# Patient Record
Sex: Female | Born: 1975 | Race: White | Hispanic: Refuse to answer | Marital: Single | Smoking: Never smoker
Health system: Southern US, Community
[De-identification: ages and names within clinical notes are randomized; demographics above are authoritative.]

## PROBLEM LIST (undated history)

## (undated) DIAGNOSIS — J45909 Unspecified asthma, uncomplicated: Secondary | ICD-10-CM

## (undated) DIAGNOSIS — R569 Unspecified convulsions: Secondary | ICD-10-CM

## (undated) DIAGNOSIS — J302 Other seasonal allergic rhinitis: Secondary | ICD-10-CM

## (undated) DIAGNOSIS — F909 Attention-deficit hyperactivity disorder, unspecified type: Secondary | ICD-10-CM

## (undated) DIAGNOSIS — E119 Type 2 diabetes mellitus without complications: Secondary | ICD-10-CM

## (undated) HISTORY — PX: OTHER SURGICAL HISTORY: SHX169

## (undated) HISTORY — PX: BREAST BIOPSY: SHX20

## (undated) HISTORY — DX: Type 2 diabetes mellitus without complications: E11.9

## (undated) HISTORY — DX: Attention-deficit hyperactivity disorder, unspecified type: F90.9

## (undated) HISTORY — DX: Unspecified convulsions: R56.9

## (undated) HISTORY — DX: Unspecified asthma, uncomplicated: J45.909

## (undated) HISTORY — DX: Other seasonal allergic rhinitis: J30.2

---

## 2017-08-02 ENCOUNTER — Encounter (INDEPENDENT_AMBULATORY_CARE_PROVIDER_SITE_OTHER): Payer: Self-pay | Admitting: Physician Assistant

## 2017-08-02 ENCOUNTER — Other Ambulatory Visit (INDEPENDENT_AMBULATORY_CARE_PROVIDER_SITE_OTHER): Payer: Self-pay | Admitting: Physician Assistant

## 2017-08-02 ENCOUNTER — Ambulatory Visit (INDEPENDENT_AMBULATORY_CARE_PROVIDER_SITE_OTHER): Payer: No Typology Code available for payment source | Admitting: Physician Assistant

## 2017-08-02 VITALS — BP 139/84 | HR 86 | Temp 97.8°F | Ht 61.89 in | Wt 296.0 lb

## 2017-08-02 DIAGNOSIS — J4520 Mild intermittent asthma, uncomplicated: Secondary | ICD-10-CM

## 2017-08-02 DIAGNOSIS — G40909 Epilepsy, unspecified, not intractable, without status epilepticus: Secondary | ICD-10-CM

## 2017-08-02 DIAGNOSIS — F902 Attention-deficit hyperactivity disorder, combined type: Secondary | ICD-10-CM

## 2017-08-02 MED ORDER — NORTRIPTYLINE HCL 25 MG PO CAPS
25.00 mg | ORAL_CAPSULE | Freq: Every evening | ORAL | 1 refills | Status: DC
Start: 2017-08-02 — End: 2017-12-07

## 2017-08-02 MED ORDER — FLUTICASONE PROPIONATE HFA 110 MCG/ACT IN AERO
1.00 | INHALATION_SPRAY | Freq: Two times a day (BID) | RESPIRATORY_TRACT | 5 refills | Status: DC
Start: 2017-08-02 — End: 2017-11-12

## 2017-08-02 NOTE — Progress Notes (Signed)
Have you seen any specialists/other providers since your last visit with us?    Yes    Arm preference verified?   Yes    The patient is due for influenza vaccine

## 2017-08-02 NOTE — Progress Notes (Signed)
Subjective:      Patient ID: [name] [name]  is a 42 y.o.  female. She is new to my practice, moved from [facility]    Chief Complaint   Patient presents with   . Establish Care     medication refill and consultation.      HPI  [name] [name] is looking to est'd new PCP. PMhx below. She is interested in changing her ADHD/Seizure medications.  Reports last Neurology work up was > 10 yrs ago. Last known sx > 44yrs ago  She did have a  Psychiatrist in [place] who managed her current medications    Hx asthma and requesting maintenance  inhaler for the winter months    Past Medical History:   Diagnosis Date   . Asthma    . Attention deficit hyperactivity disorder (ADHD)    . Convulsions    . Seasonal allergic rhinitis           The following sections were reviewed this encounter by the provider:   Tobacco  Allergies  Meds  Problems  Med Hx  Surg Hx  Fam Hx  Soc Hx         Review of Systems   Constitutional: Negative.  Negative for activity change, appetite change and unexpected weight change.   Cardiovascular: Negative for chest pain.   Neurological: Negative for dizziness, tremors, seizures, syncope, light-headedness and headaches.   Psychiatric/Behavioral: Negative for agitation, behavioral problems, decreased concentration (at times), dysphoric mood and sleep disturbance. The patient is hyperactive (at times). The patient is not nervous/anxious.          BP 139/84 (BP Site: Right arm, Patient Position: Sitting, Cuff Size: Large)   Pulse 86   Temp 97.8 F (36.6 C) (Oral)   Ht 1.572 m (5' 1.89")   Wt 134.3 kg (296 lb)   LMP 07/19/2017 (Approximate)   BMI 54.33 kg/m     Objective:   Physical Exam   Constitutional: She is oriented to person, place, and time. She appears well-developed and well-nourished.   HENT:   Head: Normocephalic.   Eyes: Pupils are equal, round, and reactive to light.   Cardiovascular: Normal rate, regular rhythm and normal heart sounds.    No murmur heard.  Pulmonary/Chest: Effort normal and breath  sounds normal.   Neurological: She is alert and oriented to person, place, and time. No cranial nerve deficit. Coordination normal.   Psychiatric: She has a normal mood and affect. Her behavior is normal. Judgment and thought content normal.        Assessment:   1. Attention deficit hyperactivity disorder (ADHD), combined type  - nortriptyline (PAMELOR) 25 MG capsule; Take 1 capsule (25 mg total) by mouth nightly.  Dispense: 30 capsule; Refill: 1  - PMP Drug Monitoring 17 Panel, Urine    2. Seizure disorder  - Neurology Referral: Eleanora Neighbor. Stacy Gardner, MD [facility])    3. Mild intermittent asthma, unspecified whether complicated  - fluticasone (FLOVENT HFA) 110 MCG/ACT inhaler; Inhale 1 puff into the lungs 2 (two) times daily.  Dispense: 1 Inhaler; Refill: 5         Plan:   PMP reviewed today  Pt contract signed  Pending urine drug screen, will then refill Adderall  Referred to Neuro for seizure evaluation    Risk & Benefits of any new medication(s) were explained to the patient who verbalized understanding & agreed to the treatment plan.     Call if symptoms persist, worsen, or change.  Call  with updates/questions/concerns.    Cristy Friedlander, MPH

## 2017-08-08 LAB — PMP DRUG MONITORING, 17 PANEL, URINE

## 2017-08-13 ENCOUNTER — Other Ambulatory Visit (INDEPENDENT_AMBULATORY_CARE_PROVIDER_SITE_OTHER): Payer: Self-pay | Admitting: Physician Assistant

## 2017-08-13 MED ORDER — AMPHETAMINE-DEXTROAMPHETAMINE 20 MG PO TABS
20.00 mg | ORAL_TABLET | Freq: Three times a day (TID) | ORAL | 0 refills | Status: DC
Start: 2017-08-13 — End: 2017-08-13

## 2017-08-13 MED ORDER — AMPHETAMINE-DEXTROAMPHETAMINE 20 MG PO TABS
20.00 mg | ORAL_TABLET | Freq: Three times a day (TID) | ORAL | 0 refills | Status: DC
Start: 2017-08-13 — End: 2017-12-07

## 2017-09-18 ENCOUNTER — Ambulatory Visit (HOSPITAL_BASED_OUTPATIENT_CLINIC_OR_DEPARTMENT_OTHER): Payer: No Typology Code available for payment source | Admitting: Neurology

## 2017-10-19 ENCOUNTER — Encounter (INDEPENDENT_AMBULATORY_CARE_PROVIDER_SITE_OTHER): Payer: Self-pay | Admitting: Neurology

## 2017-10-19 ENCOUNTER — Ambulatory Visit (INDEPENDENT_AMBULATORY_CARE_PROVIDER_SITE_OTHER): Payer: No Typology Code available for payment source | Admitting: Neurology

## 2017-10-19 VITALS — BP 139/87 | HR 92 | Ht 63.0 in | Wt 310.0 lb

## 2017-10-19 DIAGNOSIS — R569 Unspecified convulsions: Secondary | ICD-10-CM

## 2017-10-19 DIAGNOSIS — Z87898 Personal history of other specified conditions: Secondary | ICD-10-CM

## 2017-10-20 NOTE — Progress Notes (Signed)
Subjective:           Patient ID: [name] [name] is a 42 y.o. female here for Seizures  .         Seizures        Pt referred for the evaluation of seizures. Pt moved from [place]. She has a h/o seizures and was placed on AEDs from Grade 3 to senior in high school. No seizures during this period. However pt stopped the AEDs suddenly, without doctr consent, just felt it is not needed. Also she stopped taking her ADHD meds.    Since then, pt experiencing seizure like activity, but not sure. She has episodes of time gaps, unresponsive spells. But never escalated to the grand mal seizures or LOC.     Review of Systems   Neurological: Positive for seizures.     Current Outpatient Prescriptions on File Prior to Visit   Medication Sig Dispense Refill   . albuterol (PROVENTIL HFA;VENTOLIN HFA) 108 (90 Base) MCG/ACT inhaler Inhale 2 puffs into the lungs every 4 (four) hours as needed for Wheezing.     Marland Kitchen amphetamine-dextroamphetamine (ADDERALL) 20 MG tablet Take 1 tablet (20 mg total) by mouth 3 (three) times daily.Supervising Provider is Duwayne Heck, MD  REfill after 10/11/17 90 tablet 0   . fexofenadine-pseudoephedrine (ALLEGRA-D) 60-120 MG per tablet Take 1 tablet by mouth 2 (two) times daily.     . fluticasone (FLOVENT HFA) 110 MCG/ACT inhaler Inhale 1 puff into the lungs 2 (two) times daily. 1 Inhaler 5   . nortriptyline (PAMELOR) 25 MG capsule Take 1 capsule (25 mg total) by mouth nightly. 30 capsule 1   . vitamins/minerals Tab Take 1 tablet by mouth daily.       No current facility-administered medications on file prior to visit.          All systems were reviewed and were negative except as described in the HPI.    Family h/o: reviewed not contributory  Past h/o as in assessment  Social h/o: Denies smoking or drugs. Takes ETOH occasionally and socially.            Objective:      Physical Exam Neurologic Exam    Vital Signs:  Reviewed    General: The patient was well developed and well nourished.  No acute distress.  Cooperative with the exam  Neck:  no carotid bruits  CVS: RRR, no murmurs, rubs,or gallops  Resp: CTA bilaterally, no retractions  Abd: Soft, nontender  Extremities: no pedal edema, extremities normal in color    Mental Status: The patient was awake, alert and oriented to person, place, and time.  Affect is normal  Fund of knowledge appropriate  Recent and remote memory are intact   Attention span and concentration appear normal.  Language function is normal. There is no evidence of aphasia in conversational speech.    Fundi: no papilledema  Neck: NO occipital nerve  tenderness. ROM appear normal    Cranial nerves:   -CN II: Visual fields full to bedside confrontation   -CN III, IV, VI: Pupils equal, round, and reactive to light; extraocular movements intact; no ptosis              -CN V: Facial sensation intact in V1 through V3 distributions   -CN VII: Face symmetric   -CN VIII: Hearing intact to conversational speech   -CN IX, X: Palate elevates symmetrically; normal phonation   -CN XI: Symmetric full strength of sternocleidomastoid and trapezius muscles   -  CN XII: Tongue protrudes midline    Motor: Muscle tone normal without spasticity or flaccidity. No atrophy.  No fasiculations. No pronator drift.  Strength  R / L    R / L  Deltoid  5 / 5  Hip Flexion 5 / 5  Triceps  5 / 5   Hip extension 5 / 5  Biceps  5 / 5   Knee flexion 5 / 5  Wrist ext 5 / 5  Knee ext 5 / 5  Wrist flexion 5 / 5  Dorsiflexion 5 / 5  FF   5 / 5  Plantar flexion 5 / 5    Sensory:   Light touch intact.  Pinprick intact.  Temperature intact.  Vibration intact.  Proprioception intact.    Reflexes:  R / L     R / L  Biceps  2 / 2  Knees  2 / 2  Triceps 2 / 2  Ankles  2 / 2  BR                   2 / 2  Plantars Flexor / Flexor    Coordination: FTN and HKS intact, no truncal ataxia. RAMs intact. No tremors    Gait: Station normal, gait stable   Tandem walk intact.   Romberg negative.            Assessment:       H/o seizures since age 53.   Treated  with AEDs from age 35 to 48.  Seizures with LOA and staring/ unresponsive spells  Currently off AEDs > 10 years  Recurrent seizure like activity  Also diagnosed ADHD on meds  Epilepsy risk factors: Childhood seizures, No TBI/Encephalitis, No family h/o seizures  MRI/EEG done in the past > 10 years. Results N/A      Recommend:    Hold on AEDs  Will do EMu to confirm the epilepsy  MRI during       Patient needs EMU for 5 days:    Patient not on any AEDs and localize seizures for further management: AED optimization versus VNS versus neuro pace  Sleep deprivation, hyperventilation and photic stimulation from day 1  Probably  needs additional Keppra XR.        Informed consent regarding EMU monitoring: Explaining the procedure, including audiovisual recording, explaining the risk of medication reduction has been taken.  Consent form scanned into the patient's chart        Plan:      No orders of the defined types were placed in this encounter.          As above.     Additional notes and data scanned including patient questionnaire which may contain pertinent information to visit. Patient can follow up sooner if needed. In the meantime, patient will contact the office with any questions or concerns.       Counseled the patient re: spent explaining the natural history of seizures, types of seizures, triggers for seizures.  Need for the necessary testing.  EEG and MRI.  Need for a seizure medication compliance for medications.  Seizure precautions including avoid driving per [place].  6 months from the last seizure.  Side effects of seizure medications.  Avoiding triggers for seizures.  Backup plan, and the time of partial seizure and convulsive seizures.  Including going to the emergency room.  Taking emergency rescue medications.           Kimarie Coor,  MD - [facility]  NEUROLOGY  Available on XTEND paging  Board Certified in Neurology by ABPN  Board Certified Clinical Neurophysiology by ABPN     [address] [place]  [place] [address]  T [phone] F [phone]       [email or web address]      This note was generated by the Epic EMR system/ Dragon speech recognition and may contain inherent errors or omissions not intended by the user. Grammatical errors, random word insertions, deletions, pronoun errors and incomplete sentences are occasional consequences of this technology due to software limitations. Not all errors are caught or corrected.Although every attempt is made to root out erroneus and incomplete transcription, the note may still not fully represent the intent or opinion of the author. If there are questions or concerns about the content of this note or information contained within the body of this dictation they should be addressed directly with the author for clarification.*      Rosalio Macadamia, MD

## 2017-11-12 ENCOUNTER — Inpatient Hospital Stay: Payer: No Typology Code available for payment source

## 2017-11-12 ENCOUNTER — Inpatient Hospital Stay
Admission: RE | Admit: 2017-11-12 | Discharge: 2017-11-15 | DRG: 884 | Disposition: A | Discharge status: Home / Self Care | Payer: No Typology Code available for payment source | Source: Ambulatory Visit | Attending: Neurology | Admitting: Neurology

## 2017-11-12 DIAGNOSIS — R9401 Abnormal electroencephalogram [EEG]: Secondary | ICD-10-CM

## 2017-11-12 DIAGNOSIS — R404 Transient alteration of awareness: Principal | ICD-10-CM | POA: Diagnosis present

## 2017-11-12 DIAGNOSIS — J45909 Unspecified asthma, uncomplicated: Secondary | ICD-10-CM | POA: Diagnosis present

## 2017-11-12 DIAGNOSIS — F902 Attention-deficit hyperactivity disorder, combined type: Secondary | ICD-10-CM | POA: Diagnosis present

## 2017-11-12 DIAGNOSIS — R569 Unspecified convulsions: Secondary | ICD-10-CM | POA: Diagnosis present

## 2017-11-12 LAB — HCG QUANTITATIVE: hCG, Quant.: <1.2

## 2017-11-12 LAB — BASIC METABOLIC PANEL
BUN: 11 mg/dL (ref 7.0–19.0)
CO2: 28 mEq/L (ref 22–29)
Calcium: 9.9 mg/dL (ref 8.5–10.5)
Chloride: 99 mEq/L — ABNORMAL LOW (ref 100–111)
Creatinine: 0.7 mg/dL (ref 0.6–1.0)
Glucose: 121 mg/dL — ABNORMAL HIGH (ref 70–100)
Potassium: 4.2 mEq/L (ref 3.5–5.1)
Sodium: 137 mEq/L (ref 136–145)

## 2017-11-12 LAB — GFR: EGFR: >60

## 2017-11-12 MED ORDER — ALBUTEROL SULFATE (2.5 MG/3ML) 0.083% IN NEBU
2.50 mg | INHALATION_SOLUTION | RESPIRATORY_TRACT | Status: DC | PRN
Start: 2017-11-12 — End: 2017-11-14

## 2017-11-12 MED ORDER — AMPHETAMINE-DEXTROAMPHETAMINE 5 MG PO TABS
20.00 mg | ORAL_TABLET | Freq: Three times a day (TID) | ORAL | Status: DC
Start: 2017-11-12 — End: 2017-11-15
  Administered 2017-11-12 – 2017-11-14 (×6): 20 mg via ORAL
  Administered 2017-11-14: 08:00:00 5 mg via ORAL
  Administered 2017-11-14: 13:00:00 20 mg via ORAL
  Administered 2017-11-14: 08:00:00 15 mg via ORAL
  Administered 2017-11-15 (×2): 20 mg via ORAL
  Filled 2017-11-12: qty 4
  Filled 2017-11-12: qty 1
  Filled 2017-11-12: qty 4
  Filled 2017-11-12: qty 1
  Filled 2017-11-12 (×4): qty 4
  Filled 2017-11-12: qty 3
  Filled 2017-11-12: qty 4
  Filled 2017-11-12: qty 3
  Filled 2017-11-12: qty 4

## 2017-11-12 MED ORDER — LORAZEPAM 2 MG/ML IJ SOLN
2.00 mg | Freq: Once | INTRAMUSCULAR | Status: DC | PRN
Start: 2017-11-12 — End: 2017-11-15

## 2017-11-12 MED ORDER — LORAZEPAM 2 MG/ML IJ SOLN
2.00 mg | INTRAMUSCULAR | Status: DC | PRN
Start: 2017-11-12 — End: 2017-11-15

## 2017-11-12 MED ORDER — DIPHENHYDRAMINE HCL 50 MG/ML IJ SOLN
12.50 mg | Freq: Four times a day (QID) | INTRAMUSCULAR | Status: DC | PRN
Start: 2017-11-12 — End: 2017-11-15

## 2017-11-12 MED ORDER — ACETAMINOPHEN 325 MG PO TABS
650.0000 mg | ORAL_TABLET | Freq: Four times a day (QID) | ORAL | Status: DC | PRN
Start: 2017-11-12 — End: 2017-11-15
  Administered 2017-11-12 – 2017-11-15 (×6): 650 mg via ORAL
  Filled 2017-11-12 (×6): qty 2

## 2017-11-12 MED ORDER — GADOBUTROL 1 MMOL/ML IV SOLN
10.00 mL | Freq: Once | INTRAVENOUS | Status: AC | PRN
Start: 2017-11-12 — End: 2017-11-12
  Administered 2017-11-12: 20:00:00 10 mmol via INTRAVENOUS

## 2017-11-12 MED ORDER — ENOXAPARIN SODIUM 40 MG/0.4ML SC SOLN
40.00 mg | SUBCUTANEOUS | Status: DC
Start: 2017-11-12 — End: 2017-11-15
  Administered 2017-11-12 – 2017-11-14 (×3): 40 mg via SUBCUTANEOUS
  Filled 2017-11-12 (×4): qty 0.4

## 2017-11-12 MED ORDER — PATIENT SUPPLIED NON FORMULARY
2.00 | Freq: Two times a day (BID) | Status: DC | PRN
Start: 2017-11-12 — End: 2017-11-12

## 2017-11-12 MED ORDER — ONDANSETRON HCL 8 MG PO TABS
4.00 mg | ORAL_TABLET | Freq: Three times a day (TID) | ORAL | Status: DC | PRN
Start: 2017-11-12 — End: 2017-11-15

## 2017-11-12 NOTE — Progress Notes (Signed)
CASE MANAGEMENT PROGRESS NOTE      Patient: [name] [name] (42 y.o. female)  Admission Date: 11/12/2017 [facility] Day 0)    Active Hospital Problems    Diagnosis   . Seizure-like activity       Length of stay: Hospital Day 0    Patient admitted today for EEG monitoring for 3-4 days, CM will follow up with the patient to complete the initial discharge planning assessment.     Dallie Piles, MSW  Care Coordinator  Case Management   [facility]  [phone]

## 2017-11-12 NOTE — Plan of Care (Signed)
Problem: Safety  Goal: Patient will be free from injury during hospitalization  Outcome: Progressing   11/12/17 1749   Goal/Interventions addressed this shift   Patient will be free from injury during hospitalization  Assess patient's risk for falls and implement fall prevention plan of care per policy;Provide and maintain safe environment;Use appropriate transfer methods;Ensure appropriate safety devices are available at the bedside;Hourly rounding;Include patient/ family/ care giver in decisions related to safety;Assess for patients risk for elopement and implement Elopement Risk Plan per policy;Provide alternative method of communication if needed (communication boards, writing)       Problem: Pain  Goal: Pain at adequate level as identified by patient  Outcome: Progressing   11/12/17 1749   Goal/Interventions addressed this shift   Pain at adequate level as identified by patient Identify patient comfort function goal;Assess for risk of opioid induced respiratory depression, including snoring/sleep apnea. Alert healthcare team of risk factors identified.;Assess pain on admission, during daily assessment and/or before any "as needed" intervention(s);Reassess pain within 30-60 minutes of any procedure/intervention, per Pain Assessment, Intervention, Reassessment (AIR) Cycle;Evaluate if patient comfort function goal is met;Evaluate patient's satisfaction with pain management progress;Offer non-pharmacological pain management interventions;Consult/collaborate with Physical Therapy, Occupational Therapy, and/or Speech Therapy;Include patient/patient care companion in decisions related to pain management as needed       Comments:   NURSING PROGRESS NOTE    Patient Name: [name] [name] (42 y.o. female)  Admission Date: 11/12/2017 [facility] Day 0)    Shift Note: Patient admitted for seizure monitoring. AOx4, follow commands,mae,vss. Denies pain or any discomfort, pt is not on any AED's, no seizure like activity recently.  Pt oriented to unit, settled in bed and placed on vEEG. MRI questionnaire completed. Fall and seizure precautions in place, will continue to monitor pt for changes.      Recent Labs  Lab 11/12/17  1606   Sodium 137   Potassium 4.2   Chloride 99*   CO2 28   BUN 11.0   Creatinine 0.7   EGFR >60.0   Glucose 121*   Calcium 9.9               Patient Lines/Drains/Airways Status    Active Lines, Drains and Airways     Name:   Placement date:   Placement time:   Site:   Days:    Peripheral IV 11/12/17 Left Hand  11/12/17    1510    Hand    less than 1                MEWS Score:     Last BM: PTA  Pending Orders: MRI  Discharge Plan: Home    Safety Checklist    Fall Precautions Y    Avasys N/A    Seizure Precautions Y    Aspiration Precautions N/A       Interpreter Services:  Does the patient require an Interpreter? No    If yes, what form of interpreter services was used? No    If family was utilized, is interpreter waiver form signed and in the chart?No

## 2017-11-12 NOTE — H&P (Signed)
EMU ADMISSION HISTORY AND PHYSICAL EXAM    Date Time: 11/12/17 1:47 PM  Patient Name: [name] [name]  Attending Physician: Rosalio Macadamia, MD  Primary Care Physician: Roe Rutherford, PA    CC: Possible Seizures      History of Presenting Illness:   [name] [name] is a 42 y.o. female with a pmhx of seizures from age 31 who abruptly stopped AEDs in high school, ADHD (diagnosed pre-grad school), Asthma, and intermittent sleeplessness treated with pamelor who presents to [facility] 11/12/17 for elective admission to the Epilepsy Monitoring Unit. Referred by outpatient neurologist Dr. Becky Sax.    Goal(s) of EMU admission: Clarify whether spells are seizures; AED optimization    Spell types:  1. Description: episodes of time gaps, unresponsive spells, never escalated to GTC or LOC      Duration:  Seconds to minute      Frequency:  Unsure      Last episode:  Unsure      Aura:  none      Post-ictal:  Denies sleepiness    Triggers for spells: none known    Seizure risk factors:  - Brain injury:        Head trauma: No.        Stroke/Hemorrhage: No.        Meningitis: No.   - H/o febrile seizures: No  - Family hx of seizures/epilepsy: No  - Substance abuse: No    Psych hx:  denies    Prior EEG: not available for review > 10 years ago    Prior MRI/CT: not available for review > 10 years ago    AED synopsis:  - Current:  - Prior:    Past Medical History:     Past Medical History:   Diagnosis Date   . Asthma    . Attention deficit hyperactivity disorder (ADHD)    . Convulsions    . Seasonal allergic rhinitis        Past Surgical History:     Past Surgical History:   Procedure Laterality Date   . THUMB, BASILAR SCOPE WITH DEBRIDEMENT AND LIGAMENT RECONSTRUCTION Left 2015       Family History:     Family History   Problem Relation Age of Onset   . Cancer Mother         breast cancer   . Lymphoma Mother    . Heart disease Father    . Cancer Maternal Grandmother         breast cancer       Social History:      Social History     Social History   . Marital status: Single     Spouse name: N/A   . Number of children: N/A   . Years of education: N/A     Social History Main Topics   . Smoking status: Never Smoker   . Smokeless tobacco: Never Used   . Alcohol use No   . Drug use: No   . Sexual activity: Not Currently     Partners: Female     Other Topics Concern   . Not on file     Social History Narrative   . No narrative on file       Allergies:     Allergies   Allergen Reactions   . Dust Mite Extract    . Mold Extract [Trichophyton]    . Prednisone Other (See Comments)     Mood changes  ICS- OK  Medications:     Prescriptions Prior to Admission   Medication Sig   . albuterol (PROVENTIL HFA;VENTOLIN HFA) 108 (90 Base) MCG/ACT inhaler Inhale 2 puffs into the lungs every 4 (four) hours as needed for Wheezing.   Marland Kitchen amphetamine-dextroamphetamine (ADDERALL) 20 MG tablet Take 1 tablet (20 mg total) by mouth 3 (three) times daily.Supervising Provider is Duwayne Heck, MD  REfill after 10/11/17   . fexofenadine-pseudoephedrine (ALLEGRA-D) 60-120 MG per tablet Take 1 tablet by mouth 2 (two) times daily.   . nortriptyline (PAMELOR) 25 MG capsule Take 1 capsule (25 mg total) by mouth nightly.   . vitamins/minerals Tab Take 1 tablet by mouth daily.       Current Facility-Administered Medications   Medication Dose Route Frequency   . amphetamine-dextroamphetamine  20 mg Oral TID   . enoxaparin  40 mg Subcutaneous Q24H SCH       Review of Systems:   All other systems were reviewed and are negative except: as above.    Physical Exam:   There were no vitals filed for this visit.    Vital Signs:  Reviewed    General: The patient was well developed and well nourished.  No acute distress. Cooperative with the exam.  Follows commands  Neck:  No carotid bruits  CVS: RRR  Resp: CTA bilaterally, no retractions  Abd: Soft, nontender  Extremities: no pedal edema, extremities normal in color    Mental Status: The patient is awake, alert  and oriented to person, place, and time.  Affect is normal  Fund of knowledge appropriate  Recent and remote memory are intact   Attention span and concentration appear normal.  Language function is normal. There is no evidence of aphasia in conversational speech.    Cranial nerves:   -CN II: Visual fields full to bedside confrontation   -CN III, IV, VI: Pupils equal, round, and reactive to light; extraocular movements intact; no ptosis; no nystagmus              -CN V: Facial sensation intact in V1 through V3 distributions   -CN VII: Face symmetric   -CN VIII: Hearing intact to conversational speech   -CN IX, X: Normal phonation   -CN XI: Symmetric full strength of sternocleidomastoid and trapezius muscles   -CN XII: Tongue protrudes midline    Motor: Muscle tone normal without spasticity or flaccidity. No atrophy.  No pronator drift.  Strength  R / L    R / L  Deltoid  5 / 5  Hip Flexion 5 / 5  Triceps  5 / 5   Hip extension 5 / 5  Biceps  5 / 5   Knee flexion 5 / 5  Wrist ext 5 / 5  Knee ext 5 / 5  Wrist flexion 5 / 5  Dorsiflexion 5 / 5  FF   5 / 5  Plantar flexion 5 / 5    Sensory:   Light touch intact.  Temperature intact.  Vibration intact.    Reflexes:  R / L     R / L  Biceps  2 / 2  Knees  2 / 2  Triceps 2 / 2  Ankles  2 / 2  BR                   2 / 2  Plantars Flexor / Flexor    Coordination: FTN with endpoint dysmetria on the left, no truncal ataxia. RAMs intact. No tremors  Gait: Station normal, gait stable       Labs:     Results     ** No results found for the last 24 hours. **            Assessment:     Patient Active Problem List   Diagnosis   . Seizure-like activity       42 y.o. female with a pmhx of seizures from age 81 who abruptly stopped AEDs in high school, ADHD (diagnosed pre-grad school), Asthma, and intermittent sleeplessness treated with pamelor who presents to [facility] 11/12/17 for elective admission to the Epilepsy Monitoring Unit. Referred by outpatient neurologist Dr.  Becky Sax.    Plan:   EMU monitoring on cvEEG x 3-5 Days off AED  Patient will undergo partial night sleep deprivation (May sleep 0200-0600).  No napping  Patient will have photic stimulation and hyperventilation testing in am daily  Give 2 mg IV Ativan for seizures >2 minutes  Notify Neurologist (Dr. Becky Sax) of any seizure activity  For other urgent issues daytime call Spectralink [phone]              After business hours: 1630-0800 call Neurologist on Call [phone]  Seizure Precautions  DVT/GI prophylaxis - SCDs.  Follow up MRI WWO contrast seizure protocol    Status/Rationale:  Admit to EMU.    Signed by:  Norvel Richards. Clemon Chambers, FNP-BC  Nurse Practitioner  [facility] Neurology  Pager: [phone]  Spectralink [phone]  [facility] Consult Spectra: [phone]  After Hours: [phone]    Please see attending Neurologist note that accompanies this mid-level encounter note.    Neurology Attending Note:    I reviewed the chart, relevant neuro-imaging and labs on the above patient.  I personally examined the patient.  I also conferred with the midlevel and agree with the findings and plan outlined above.         Kendrick Fries, M.D.  Board Certified Neurology, ABPN  Board Certified NeuroImaging, UCNS   [facility]

## 2017-11-12 NOTE — Procedures (Signed)
DOS: 4/28 to 11/12/17      INDICATION:   A long-term video EEG monitoring was recommended in this 42 year-old woman with h/o recurrent seizure-like activity  to localize seizures for advanced management.     Recent neuroimaging dated : Not done recently.  Pending in EMU      Home Medications before EMU: None    SUMMARY OF FINDINGS:     1. Ictal events: as in impression       2. Background: There is a 9 -10 Hz posteriorly dominant alpha rhythm that is waxing and waning with eye opening, and of medium amplitude. Sleep stages seen with symmetric central spindling and vertex waves. Bifrontal low voltage fast beta activity.   Hemispheric asymmetry : none      3. Abnormal findings: No clear focal slowing, epileptiform discharges, electrographic seizures or status seen.  Marland Kitchen     IMPRESSION AND COMMENTS : Recorded normal awake and sleep EEG No typical events noted    Rosalio Macadamia, MD - [place]  NEUROLOGY  Available on XTEND paging  Board Certified in Neurology by ABPN  Board Certified Clinical Neurophysiology by ABPN                                    "Recommendation to the in charge Nurse: please push the "push button" in the event suspicious for seizure, so that these events will be evaluated more closely for EEG correlation."

## 2017-11-12 NOTE — Plan of Care (Signed)
Problem: Safety  Goal: Patient will be free from injury during hospitalization  Outcome: Progressing   11/12/17 2327   Goal/Interventions addressed this shift   Patient will be free from injury during hospitalization  Assess patient's risk for falls and implement fall prevention plan of care per policy;Provide and maintain safe environment;Use appropriate transfer methods;Ensure appropriate safety devices are available at the bedside;Assess for patients risk for elopement and implement Elopement Risk Plan per policy;Hourly rounding;Include patient/ family/ care giver in decisions related to safety       Problem: Pain  Goal: Pain at adequate level as identified by patient  Outcome: Progressing   11/12/17 2327   Goal/Interventions addressed this shift   Pain at adequate level as identified by patient Identify patient comfort function goal;Assess pain on admission, during daily assessment and/or before any "as needed" intervention(s);Reassess pain within 30-60 minutes of any procedure/intervention, per Pain Assessment, Intervention, Reassessment (AIR) Cycle;Evaluate if patient comfort function goal is met;Consult/collaborate with Physical Therapy, Occupational Therapy, and/or Speech Therapy;Offer non-pharmacological pain management interventions;Evaluate patient's satisfaction with pain management progress       Problem: Moderate/High Fall Risk Score >5  Goal: Patient will remain free of falls   11/12/17 2000   OTHER   High (Greater than 13) HIGH-Consider use of low bed

## 2017-11-12 NOTE — Progress Notes (Signed)
As per order, EEG electrodes MRI were applied with collodion and paste in accordance to the International 10-20 system and gauze wrap placed for electrode security. The assigned nurse and family were informed of the recording protocol and the patient event button.

## 2017-11-13 DIAGNOSIS — R9401 Abnormal electroencephalogram [EEG]: Secondary | ICD-10-CM

## 2017-11-13 NOTE — Progress Notes (Signed)
NURSING PROGRESS NOTE    Patient Name: [name] [name] (42 y.o. female)  Admission Date: 11/12/2017 [facility] Day 1)    Shift Note: Patient is alert and oriented x 4, denies pain, mae, follows commands, strength 5/5, continent of b+b, lungs cta, VSS, no seizure activity, sleep deprivation continues, no complaints will ctm.       Recent Labs  Lab 11/12/17  1606   Sodium 137   Potassium 4.2   Chloride 99*   CO2 28   BUN 11.0   Creatinine 0.7   EGFR >60.0   Glucose 121*   Calcium 9.9               Patient Lines/Drains/Airways Status    Active Lines, Drains and Airways     Name:   Placement date:   Placement time:   Site:   Days:    Peripheral IV 11/12/17 Left Hand  11/12/17    1510    Hand    less than 1                MEWS Score: 2    Last BM: 11/12/17  Pending Orders: none  Discharge Plan: TBD    Safety Checklist    Fall Precautions Y    Avasys N    Seizure Precautions Y    Aspiration Precautions N       Interpreter Services:  Does the patient require an Interpreter? N    If yes, what form of interpreter services was used? N     If family was utilized, is Tour manager form signed and in the chart? N

## 2017-11-13 NOTE — Progress Notes (Signed)
[facility] Consultation Progress Note    Date Time: 11/13/17 2:02 PM  Patient Name: [name] [name],[name] [name]  Outpatient Neurologist :  Dr. Becky Sax    CC: Possible Seizures      Assessment:   42 y.o. 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 Referred by outpatient neurologist Dr. Becky Sax.    Patient Active Problem List   Diagnosis   . Seizure-like activity   . Nonspecific abnormal electroencephalogram (EEG)       Plan:   EMU monitoring on cvEEG x 3-5 Days off AED  Patient will undergo partial night sleep deprivation (May sleep 0200-0600).  No napping  Patient will have photic stimulation and hyperventilation testing in am daily  Give 2 mg IV Ativan for seizures >2 minutes  Notify Neurologist (Dr. Becky Sax) of any seizure activity  For other urgent issues daytime call Spectralink [phone]  After business hours: 1630-0800 call Neurologist on Call [phone] or CNS Hospitalist  Seizure Precautions  DVT/GI prophylaxis - SCDs.    Interval History/Subjective:   No acute neurological events reported or documented overnight.  Patient is awake, alert, answering questions and following commands appropriately. Patient reports that she tolerated the partial night sleep deprivation without difficulty.  She denies headache, dizziness, n/v, weakness, paresthesias and visual disturbance.    CVEEG  4/29- 11/13/17  IMPRESSION AND COMMENTS : Recorded normal awake and sleep EEG No typical events noted    MRI Brain W WO Contrast 11/12/17  IMPRESSION:   Nonspecific white matter disease about the subcortical white matter about the posterior frontal lobes.    They are nonspecific however probably represent gliosis from prior inflammatory disease or small vessel ischemic changes, and these are more  commonly seen in patient with history of hypertension, diabetes, migraine, as well as Lyme disease.     Medications:     Current Facility-Administered Medications   Medication Dose Route Frequency   . amphetamine-dextroamphetamine  20 mg Oral TID   . enoxaparin  40 mg Subcutaneous Q24H SCH       Review of Systems:   A comprehensive review of systems was negative except as documented in the above interval history.    Physical Exam:   Temp:  [97.5 F (36.4 C)-98.9 F (37.2 C)] 97.5 F (36.4 C)  Heart Rate:  [85-93] 85  Resp Rate:  [18-20] 18  BP: (142-172)/(65-79) 146/70    Vital Signs:  Reviewed    General: The patient was well developed and well nourished.  No acute distress. Cooperative with the exam.  Follows commands  Extremities: no pedal edema, extremities normal in color    Mental Status: The patient is awake, alert and oriented to person, place, and time.  Affect is normal  Fund of knowledge appropriate  Recent and remote memory are intact   Attention span and concentration appear normal.  Language function is normal. There is no evidence of aphasia in conversational speech.    Cranial nerves:   -CN II: Visual fields full to bedside confrontation   -CN III, IV, VI: Pupils equal, round, and reactive to light; extraocular movements intact; no ptosis              -CN V: Facial sensation intact in V1 through V3 distributions   -CN VII: Face symmetric   -CN VIII: Hearing  intact to conversational speech   -CN IX, X: Normal phonation   -CN XI: Symmetric full strength of sternocleidomastoid and trapezius muscles   -CN XII: Tongue protrudes midline    Motor: Muscle tone normal without spasticity or flaccidity. No atrophy.  No pronator drift.  Strength  R / L    R / L  Deltoid  5 / 5  Hip Flexion 5 / 5  Triceps  5 / 5   Hip extension 5 / 5  Biceps  5 / 5   Knee flexion 5 / 5  Wrist ext 5 / 5  Knee ext 5 / 5  Wrist flexion 5 / 5  Dorsiflexion 5 / 5  FF   5 / 5  Plantar flexion 5 / 5    Sensory:   Light touch  intact.  Temperature intact.  Vibration intact.    Reflexes:  R / L     R / L  Biceps  2 / 2  Knees  2 / 2  Triceps 2 / 2  Ankles  2 / 2  BR                   2 / 2  Plantars Flexor / Flexor    Coordination: No truncal ataxia. RAMs intact. No tremors    Gait: Deferred due to patient safety concerns      Labs:     Results     Procedure Component Value Units Date/Time    Beta HCG, Quant, Serum [[id number]] Collected:  11/12/17 1606     Updated:  11/12/17 1642     hCG, Quant. <1.2    Basic Metabolic Panel [[id number]]  (Abnormal) Collected:  11/12/17 1606    Specimen:  Blood Updated:  11/12/17 1633     Glucose 121 (H) mg/dL      BUN 24.4 mg/dL      Creatinine 0.7 mg/dL      Calcium 9.9 mg/dL      Sodium 010 mEq/L      Potassium 4.2 mEq/L      Chloride 99 (L) mEq/L      CO2 28 mEq/L     GFR [[id number]] Collected:  11/12/17 1606     Updated:  11/12/17 1633     EGFR >60.0          Rads:   Mri Brain W Wo Contrast    Result Date: 11/12/2017  Nonspecific white matter disease about the subcortical white matter about the posterior frontal lobes.    They are nonspecific however probably represent gliosis from prior inflammatory disease or small vessel ischemic changes, and these are more commonly seen in patient with history of hypertension, diabetes, migraine, as well as Lyme disease.    Einar Pheasant, MD 11/12/2017 7:37 PM        Signed by:  Norvel Richards. Clemon Chambers, FNP-BC  Nurse Practitioner  [facility] Neurology  Pager: [phone]  Spectralink [phone]  [facility] Consult Spectra: [phone]  After Hours: [phone]    Please see attending Neurologist note that accompanies this mid-level encounter note.      Neurology Attending Note:    I reviewed the chart, relevant neuro-imaging and labs on the above patient.  I personally examined the patient.  I also conferred with the midlevel and agree with the findings and plan outlined above.         Kendrick Fries, M.D.  Board Certified Neurology, ABPN  Board Certified NeuroImaging, UCNS  [facility]

## 2017-11-13 NOTE — Procedures (Signed)
DOS: 4/29 to 11/13/17      INDICATION:   A long-term video EEG monitoring was recommended in this 42 year-old woman with h/o recurrent seizure-like activity  to localize seizures for advanced management.     Recent neuroimaging dated : Not done recently.  Pending in EMU      Home Medications before EMU: None    SUMMARY OF FINDINGS:     1. Ictal events: as in impression       2. Background: There is a 9 -10 Hz posteriorly dominant alpha rhythm that is waxing and waning with eye opening, and of medium amplitude. Sleep stages seen with symmetric central spindling and vertex waves. Bifrontal low voltage fast beta activity.   Hemispheric asymmetry : none      3. Abnormal findings: No clear focal slowing, epileptiform discharges, electrographic seizures or status seen.  Marland Kitchen     IMPRESSION AND COMMENTS : Recorded normal awake and sleep EEG No typical events noted    Recommend:    COntinue CVEEG to capture few spells of clinical interest  Daily slp dep /HV / PS      Rosalio Macadamia, MD - [place]  NEUROLOGY  Available on XTEND paging  Board Certified in Neurology by ABPN  Board Certified Clinical Neurophysiology by ABPN                                    "Recommendation to the in charge Nurse: please push the "push button" in the event suspicious for seizure, so that these events will be evaluated more closely for EEG correlation."

## 2017-11-13 NOTE — Procedures (Signed)
DOS:  11/13/17 to 11/14/17        INDICATION:   A long-term video EEG monitoring was recommended in this 42 year-old woman with h/o recurrent seizure-like activity  to localize seizures for advanced management.     Recent neuroimaging dated : Not done recently.  Pending in EMU      Home Medications before EMU: None    SUMMARY OF FINDINGS:     1. Ictal events: as in impression       2. Background: There is a 9 -10 Hz posteriorly dominant alpha rhythm that is waxing and waning with eye opening, and of medium amplitude. Sleep stages seen with symmetric central spindling and vertex waves. Bifrontal low voltage fast beta activity.   Hemispheric asymmetry : none      3. Abnormal findings: No clear focal slowing, epileptiform discharges, electrographic seizures or status seen.  Marland Kitchen     IMPRESSION AND COMMENTS : Recorded normal awake and sleep EEG No typical events noted    Recommend:  Will see patient today afternoon  COntinue CVEEG to capture few spells of clinical interest  Daily slp dep /HV / PS      Rosalio Macadamia, MD - [place]  NEUROLOGY  Available on XTEND paging  Board Certified in Neurology by ABPN  Board Certified Clinical Neurophysiology by ABPN                                    "Recommendation to the in charge Nurse: please push the "push button" in the event suspicious for seizure, so that these events will be evaluated more closely for EEG correlation."

## 2017-11-13 NOTE — Progress Notes (Signed)
Activation done.

## 2017-11-13 NOTE — UM Notes (Signed)
Admit to inpatient:11/12/17 1231      CC: Possible Seizures      History of Presenting Illness:   [name] [name] is a 42 y.o. female with a pmhx of seizures from age 80 who abruptly stopped AEDs in high school, ADHD (diagnosed pre-grad school), Asthma, and intermittent sleeplessness treated with pamelor who presents to [facility] 11/12/17 for elective admission to the Epilepsy Monitoring Unit. Referred by outpatient neurologist Dr. Becky Sax.      VS: 97.5, 88, 18, 142/65    Plan:   EMU monitoring on cvEEG x 3-5 Days off AED  Patient will undergo partial night sleep deprivation (May sleep 0200-0600).  No napping  Patient will have photic stimulation and hyperventilation testing in am daily  Give 2 mg IV Ativan for seizures >2 minutes  Notify Neurologist (Dr. Becky Sax) of any seizure activity  For other urgent issues daytime call Spectralink [phone]  After business hours: 1630-0800 call Neurologist on Call [phone]  Seizure Precautions  DVT/GI prophylaxis - SCDs.  Follow up MRI WWO contrast seizure protocol      Goal(s) of EMU admission: Clarify whether spells are seizures; AED optimization    Spell types:  1. Description: episodes of time gaps, unresponsive spells, never escalated to GTC or LOC      Duration:  Seconds to minute      Frequency:  Unsure      Last episode:  Unsure      Aura:  none      Post-ictal:  Denies sleepiness    Triggers for spells: none known    Seizure risk factors:  - Brain injury:        Head trauma: No.        Stroke/Hemorrhage: No.        Meningitis: No.   - H/o febrile seizures: No  - Family hx of seizures/epilepsy: No  - Substance abuse: No    Psych hx:  denies    Prior EEG: not available for review > 10 years ago    Prior MRI/CT: not available for review > 10 years ago        Kendrick Ranch  RN/CM  [facility]  Utilization Review Nurse  Cell # [phone]  Office # [phone]

## 2017-11-13 NOTE — Progress Notes (Signed)
CM met with the patient and hisson at bedside to complete the initial discharge planning assessment. The patient was alert and oriented X4. Reported prior to admission he was independent living with her niece at home.     Currently the patient was admitted for EEG monitoring for 2-3 days.     No discharge planning needs were identified however, case manager will continue to follow patient to ensure safe and effective discharge is in place.    Disposition: Home with outpatient follow up.       11/13/17 1700   Patient Type   Within 30 Days of Previous Admission? No   Healthcare Decisions   Interviewed: Patient   Orientation/Decision Making Abilities of Patient Alert and Oriented x3, able to make decisions   Advance Directive Patient does not have advance directive   Advance Directive not in Chart (None)   Healthcare Agent Appointed No   Healthcare Agent's Name N/A   Healthcare Agent's Phone Number N/A   Additional Emergency Contacts? N/A   Prior to admission   Prior level of function Independent with ADLs   Type of Residence Private residence   Home Layout One level   Have running water, electricity, heat, etc? Yes   Living Arrangements Family members  (Patient lives with her niece)   How do you get to your MD appointments? self    How do you get your groceries? self   Who fixes your meals? self    Who does your laundry? self    Who picks up your prescriptions? self    Dressing Independent   Grooming Independent   Feeding Independent   Bathing Independent   Toileting Independent   DME Currently at Home (None)   Name of Prior Assisted Living Facility None   Home Care/Community Services None  (none)   Prior SNF admission? (Detail) None   Prior Rehab admission? (Detail) None   Adult Protective Services (APS) involved? No   Discharge Planning   Support Systems Family members   Patient expects to be discharged to: Home with outpatient follow up .    Anticipated [place] plan discussed with: Same as interviewed   [place] discussion  contact information: CM confirmed information listed on the patient's facesheet   Potential barriers to discharge: (None)   Mode of transportation: Private car (family member)   Consults/Providers   PT Evaluation Needed 2   OT Evalulation Needed 2   SLP Evaluation Needed 2   Outcome Palliative Care Screen Screened but did not meet criteria for intervention   Correct PCP listed in Epic? Yes   Important Message from Medicare Notice   Patient received 1st IMM Letter? n/a     Dallie Piles, MSW  Care Coordinator   Case Management  [facility]  [phone]

## 2017-11-13 NOTE — Plan of Care (Signed)
Problem: Safety  Goal: Patient will be free from injury during hospitalization  Outcome: Progressing   11/13/17 1420   Goal/Interventions addressed this shift   Patient will be free from injury during hospitalization  Assess patient's risk for falls and implement fall prevention plan of care per policy;Use appropriate transfer methods;Provide and maintain safe environment;Ensure appropriate safety devices are available at the bedside;Hourly rounding;Include patient/ family/ care giver in decisions related to safety;Provide alternative method of communication if needed (communication boards, writing);Assess for patients risk for elopement and implement Elopement Risk Plan per policy       Problem: Pain  Goal: Pain at adequate level as identified by patient  Outcome: Progressing   11/13/17 1420   Goal/Interventions addressed this shift   Pain at adequate level as identified by patient Assess for risk of opioid induced respiratory depression, including snoring/sleep apnea. Alert healthcare team of risk factors identified.;Identify patient comfort function goal;Assess pain on admission, during daily assessment and/or before any "as needed" intervention(s);Reassess pain within 30-60 minutes of any procedure/intervention, per Pain Assessment, Intervention, Reassessment (AIR) Cycle;Offer non-pharmacological pain management interventions;Evaluate patient's satisfaction with pain management progress;Evaluate if patient comfort function goal is met       Problem: Moderate/High Fall Risk Score >5  Goal: Patient will remain free of falls   11/13/17 1420   OTHER   High (Greater than 13) LOW-Fall Interventions Appropriate for Low Fall Risk;LOW-Anticoagulation education for injury risk       Comments:   NURSING PROGRESS NOTE    Patient Name: [name] [name] (42 y.o. female)  Admission Date: 11/12/2017 [facility] Day 1)    Shift Note: Patient is AOX4, follow commands,mae,vss. No siezure activity observed or reported thus far,vEEG in  progress. Out independently, has no new complaints, fall and seizure precautions in place, will continue to monitor pt for changes.      Recent Labs  Lab 11/12/17  1606   Sodium 137   Potassium 4.2   Chloride 99*   CO2 28   BUN 11.0   Creatinine 0.7   EGFR >60.0   Glucose 121*   Calcium 9.9               Patient Lines/Drains/Airways Status    Active Lines, Drains and Airways     Name:   Placement date:   Placement time:   Site:   Days:    Peripheral IV 11/12/17 Left Hand  11/12/17    1510    Hand    less than 1                MEWS Score: 1    Last BM: 11/13/2017  Pending Orders: None  Discharge Plan: Home    Safety Checklist    Fall Precautions No    Avasys No    Seizure Precautions Y    Aspiration Precautions N/A       Interpreter Services:  Does the patient require an Interpreter? No    If yes, what form of interpreter services was used?No  If family was utilized, is interpreter waiver form signed and in the chart? No

## 2017-11-14 MED ORDER — ALBUTEROL SULFATE HFA 108 (90 BASE) MCG/ACT IN AERS
2.00 | INHALATION_SPRAY | Freq: Four times a day (QID) | RESPIRATORY_TRACT | Status: DC | PRN
Start: 2017-11-14 — End: 2017-11-15
  Filled 2017-11-14: qty 1

## 2017-11-14 NOTE — Progress Notes (Signed)
NURSING PROGRESS NOTE    Patient Name: [name] [name] (42 y.o. female)  Admission Date: 11/12/2017 [facility] Day 2)    Shift Note: Assumed care of pt at 0700.  Pt Ox4, FC, MAE, VSS, had HA in morning given tylenol.  On cvEEG, no staring spells witnessed. OOB to BR SBG per siezure protocol. Pt sleep deprived this shift.  Safety precautions maintained, floor mats in place, bed alarm on, call bell within reach.      Recent Labs  Lab 11/12/17  1606   Sodium 137   Potassium 4.2   Chloride 99*   CO2 28   BUN 11.0   Creatinine 0.7   EGFR >60.0   Glucose 121*   Calcium 9.9               Patient Lines/Drains/Airways Status    Active Lines, Drains and Airways     Name:   Placement date:   Placement time:   Site:   Days:    Peripheral IV 11/12/17 Left Hand  11/12/17    1510    Hand    2                MEWS Score: 1    Last BM: 5/1  Pending Orders: sleep deprivation  Discharge Plan: home friday    Safety Checklist    Fall Precautions y    Avasys n    Seizure Precautions y    Aspiration Precautions n       Interpreter Services:  Does the patient require an Interpreter? no    If yes, what form of interpreter services was used? na     If family was utilized, is interpreter waiver form signed and in the chart? na

## 2017-11-14 NOTE — Procedures (Signed)
DOS:   11/14/17 to 11/15/17        INDICATION:   A long-term video EEG monitoring was recommended in this 42 year-old woman with h/o recurrent seizure-like activity  to localize seizures for advanced management.     Recent neuroimaging dated : Not done recently.  Pending in EMU      Home Medications before EMU: None    SUMMARY OF FINDINGS:     1. Ictal events: as in impression       2. Background: There is a 9 -10 Hz posteriorly dominant alpha rhythm that is waxing and waning with eye opening, and of medium amplitude. Sleep stages seen with symmetric central spindling and vertex waves. Bifrontal low voltage fast beta activity.   Hemispheric asymmetry : none      3. Abnormal findings: No clear focal slowing, epileptiform discharges, electrographic seizures or status seen.      Marland Kitchen     IMPRESSION AND COMMENTS : Recorded normal awake and sleep EEG No typical events noted    Recommend:  [place] CVEEG.  No AED recommended, will follow up as an out patient 12/07/17       Rosalio Macadamia, MD - [place]  NEUROLOGY  Available on XTEND paging  Board Certified in Neurology by ABPN  Board Certified Clinical Neurophysiology by ABPN                                    "Recommendation to the in charge Nurse: please push the "push button" in the event suspicious for seizure, so that these events will be evaluated more closely for EEG correlation."

## 2017-11-14 NOTE — Progress Notes (Signed)
[facility] Consultation Progress Note    Date Time: 11/14/17 9:14 AM  Patient Name: [name] [name],[name] [name]  Outpatient Neurologist :  Dr. Becky Sax    CC: Possible Seizures      Assessment:   42 y.o. 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 Referred by outpatient neurologist Dr. Becky Sax.    Patient Active Problem List   Diagnosis   . Seizure-like activity   . Nonspecific abnormal electroencephalogram (EEG)       Plan:   EMU monitoring on cvEEG x 3-5 Days off AED  Patient will undergo partial night sleep deprivation (May sleep 0200-0600).  No napping  Patient will have photic stimulation and hyperventilation testing in am daily  Give 2 mg IV Ativan for seizures >2 minutes  Notify Neurologist (Dr. Becky Sax) of any seizure activity  For other urgent issues daytime call Spectralink [phone]  After business hours: 1630-0800 call Neurologist on Call [phone] or CNS Hospitalist  Seizure Precautions  DVT/GI prophylaxis - SCDs.    Attending note:   The patient was seen and examined by me. Interval history reviewed. All pertinent parts of the neurological exam were performed and confirmed by me, with any additional findings on exam noted in bold. I agree with the assessment and plan as outlined by the mid-level provider as above, with any additional considerations or recommendations as noted below.    Continuous video EEG so for benign.  Would monitor until tomorrow morning if no evidence for seizures, would discharge with no AEDs.  The plan was discussed in detail with the patient.  Understands and agreeable    Rosalio Macadamia, MD - [facility]  NEUROLOGY  Available on XTEND paging  Board Certified in Neurology by ABPN  Board Certified Clinical Neurophysiology by ABPN          Interval History/Subjective:   No acute neurological events reported or documented overnight.  Patient is awake, alert, answering questions and following commands appropriately. Patient reports that she tolerated the partial night sleep deprivation without difficulty.  She denies headache, dizziness, n/v, weakness, paresthesias and visual disturbance.    CVEEG  4/29- 11/13/17  IMPRESSION AND COMMENTS : Recorded normal awake and sleep EEG No typical events noted    cvEEG 4/31- 11/14/17  IMPRESSION AND COMMENTS : Recorded normal awake and sleep EEG No typical events noted    MRI Brain W WO Contrast 11/12/17  IMPRESSION:   Nonspecific white matter disease about the subcortical white matter about the posterior frontal lobes.    They are nonspecific however probably represent gliosis from prior inflammatory disease or small vessel ischemic changes, and these are more commonly seen in patient with history of hypertension, diabetes, migraine, as well as Lyme disease.     Medications:     Current Facility-Administered Medications   Medication Dose Route Frequency   . amphetamine-dextroamphetamine  20 mg Oral TID   . enoxaparin  40 mg Subcutaneous Q24H SCH       Review of Systems:   A comprehensive review of systems was negative except as documented in the above interval history.    Physical Exam:   Temp:  [97.4 F (36.3 C)-98.8 F (37.1 C)] 98.8 F (37.1 C)  Heart Rate:  [92-98] 98  Resp Rate:  [16-18] 16  BP: (147-149)/(72-83) 149/72  Vital Signs:  Reviewed    General: The patient was well developed and well nourished.  No acute distress. Cooperative with the exam.  Follows commands  Extremities: no pedal edema, extremities normal in color    Mental Status: The patient is awake, alert and oriented to person, place, and time.  Affect is normal  Fund of knowledge appropriate  Recent and remote memory are intact   Attention span and concentration appear normal.  Language function is normal. There is no evidence of  aphasia in conversational speech.    Cranial nerves:   -CN II: Visual fields full to bedside confrontation   -CN III, IV, VI: Pupils equal, round, and reactive to light; extraocular movements intact; no ptosis              -CN V: Facial sensation intact in V1 through V3 distributions   -CN VII: Face symmetric   -CN VIII: Hearing intact to conversational speech   -CN IX, X: Normal phonation   -CN XI: Symmetric full strength of sternocleidomastoid and trapezius muscles   -CN XII: Tongue protrudes midline    Motor: Muscle tone normal without spasticity or flaccidity. No atrophy.  No pronator drift.  Strength  R / L    R / L  Deltoid  5 / 5  Hip Flexion 5 / 5  Triceps  5 / 5   Hip extension 5 / 5  Biceps  5 / 5   Knee flexion 5 / 5  Wrist ext 5 / 5  Knee ext 5 / 5  Wrist flexion 5 / 5  Dorsiflexion 5 / 5  FF   5 / 5  Plantar flexion 5 / 5    Sensory:   Light touch intact.  Temperature intact.  Vibration intact.    Reflexes:  R / L     R / L  Biceps  2 / 2  Knees  2 / 2  Triceps 2 / 2  Ankles  2 / 2  BR                   2 / 2  Plantars Flexor / Flexor    Coordination: No truncal ataxia. RAMs intact. No tremors    Gait: Deferred due to patient safety concerns      Labs:     Results     ** No results found for the last 24 hours. **          Rads:   Mri Brain W Wo Contrast    Result Date: 11/12/2017  Nonspecific white matter disease about the subcortical white matter about the posterior frontal lobes.    They are nonspecific however probably represent gliosis from prior inflammatory disease or small vessel ischemic changes, and these are more commonly seen in patient with history of hypertension, diabetes, migraine, as well as Lyme disease.    Einar Pheasant, MD 11/12/2017 7:37 PM        Signed by:  Norvel Richards. Clemon Chambers, FNP-BC  Nurse Practitioner   [facility] Neurology  Pager: [phone]  Spectralink [phone]  [facility] Consult Spectra: [phone]  After Hours: [phone]    Please see attending Neurologist note that accompanies this  mid-level encounter note.

## 2017-11-14 NOTE — Progress Notes (Signed)
CASE MANAGEMENT PROGRESS NOTE      Patient: [name] [name] (42 y.o. female)  Admission Date: 11/12/2017 [facility] Day 2)    Active Hospital Problems    Diagnosis   . Nonspecific abnormal electroencephalogram (EEG)   . Seizure-like activity       Length of stay: Hospital Day 2      Disposition: Home with outpatient follow up.    Discharge plan: The patient continues with EEG monitoring pending clearance. Once she is ready for discharge she will follow up with neuro outpatient.  Anticipated date of discharge: 1-2 days.    Barriers to discharge: medical clearance.    Updates: None.  Dallie Piles, MSW  Care Coordinator  Case Management   [facility]  [phone]

## 2017-11-14 NOTE — UM Notes (Signed)
Continued Stay Review  CSR Date: day 2 for 4/30     DX: Seizure-like activity [R56.9]    A long-term video EEG monitoring was recommended in this 42 year-old woman with h/o recurrent seizure-like activity  to localize seizures for advanced management    pmhx of seizures from age 67    VS: 99.7--89--18--176/79--96% RA     IMPRESSION AND COMMENTS : Recorded normal awake and sleep EEG No typical events noted    Recommend:  COntinue CVEEG to capture few spells of clinical interest  Daily slp dep /HV / PS    PLAN:   EMU monitoring on cvEEG x 3-5 Days off AED  Patient will undergo partial night sleep deprivation (May sleep 0200-0600). No napping  Patient will have photic stimulation and hyperventilation testing in am daily  Give 2 mg IV Ativan for seizures >2 minutes  Notify Neurologist (Dr. Becky Sax) of any seizure activity  For other urgent issues daytime call Spectralink [phone]  After business hours: 1630-0800 call Neurologist on Call [phone] or CNS Hospitalist  Seizure Precautions  DVT/GI prophylaxis - SCDs.    UTILIZATION REVIEW CONTACT: Name: Jarrett Soho, RN BSN  Clinical Case Manager  - Utilization Review  [facility]  Address:  [address] [place], [place]  [address]  NPI:   [id number]  Tax ID:  [id number]  Phone: [phone]  Fax: [phone]    Please use fax number [phone] to provide authorization for hospital services or to request additional information.

## 2017-11-14 NOTE — Plan of Care (Signed)
Problem: Pain  Goal: Pain at adequate level as identified by patient  Outcome: Progressing   11/14/17 1652   Goal/Interventions addressed this shift   Pain at adequate level as identified by patient Identify patient comfort function goal  Assess for risk of opioid induced respiratory depression  Assess pain on admission, during daily assessment  Reassess pain within 30-60 minutes of any procedure/intervention  Evaluate patient's satisfaction with pain management progress  Evaluate if patient comfort function goal is met

## 2017-11-14 NOTE — Plan of Care (Signed)
Problem: Moderate/High Fall Risk Score >5  Goal: Patient will remain free of falls  Outcome: Progressing   11/13/17 2000   OTHER   High (Greater than 13) HIGH-Activate bed/chair exit alarm where available;HIGH-Apply yellow "Fall Risk" arm band;HIGH-Initiate use of floor mats as appropriate       Problem: Neurological Deficit  Goal: Neurological status is stable or improving  Outcome: Progressing   11/14/17 0111   Goal/Interventions addressed this shift   Neurological status is stable or improving Monitor/assess/document neurological assessment (Stroke: every 4 hours);Observe for seizure activity and initiate seizure precautions if indicated;Perform CAM Assessment       Comments:   NURSING PROGRESS NOTE    Patient Name: [name] [name] (42 y.o. female)  Admission Date: 11/12/2017 [facility] Day 2)    Shift Note: AOx4, FC, MAE, VSS, denies pain. On cvEEG, no staring spells witnessed. OOB to BR SBG per siezure protocol. Pt sleep deprived this shift. C/o head itchiness, prn benadryl offered, pt declines at this time. Safety precautions maintained, floor mats in place, bed alarm on, call bell within reach.        Recent Labs  Lab 11/12/17  1606   Sodium 137   Potassium 4.2   Chloride 99*   CO2 28   BUN 11.0   Creatinine 0.7   EGFR >60.0   Glucose 121*   Calcium 9.9               Patient Lines/Drains/Airways Status    Active Lines, Drains and Airways     Name:   Placement date:   Placement time:   Site:   Days:    Peripheral IV 11/12/17 Left Hand  11/12/17    1510    Hand    1                MEWS Score: 1    Last BM: 4/30  Pending Orders: CVEEG, PS, HV, SD  Discharge Plan: Home 3-5 days     Safety Checklist    Fall Precautions Y    Avasys N    Seizure Precautions Y    Aspiration Precautions Y       Interpreter Services:  Does the patient require an Interpreter? No

## 2017-11-15 NOTE — Progress Notes (Signed)
The following test LTEM has been discontinued as per order. The EEG electrodes were removed with Mavidon and skin break down was not present. If present the following areas were affected No visible skin break-down and the assigned nurse was informed. The report will follow.

## 2017-11-15 NOTE — Discharge Summary -  Nursing (Signed)
DISCHARGE NOTE      Patient: [name] [name],[name] [name] (42 y.o., Sep 27, 1975)  Admission Date: 11/12/2017 (LOS: 3)    Discharge Date: 11/15/17  Discharge Disposition: Home  Report Given: Yes  Transportation Method: Private  Additional Discharge Info: Patient was cleared to be discharged home. Discharged instructions were given and patient verbalized understanding of the discharged teachings. IV taken out prior to discharge. Patient left around 1440 carrying all her belongings.    Discharge Medication List       Medication List      CHANGE how you take these medications    nortriptyline 25 MG capsule  Commonly known as:  PAMELOR  Take 1 capsule (25 mg total) by mouth nightly.  What changed:   when to take this   reasons to take this        CONTINUE taking these medications    albuterol 108 (90 Base) MCG/ACT inhaler  Commonly known as:  PROVENTIL HFA;VENTOLIN HFA     amphetamine-dextroamphetamine 20 MG tablet  Commonly known as:  ADDERALL  Take 1 tablet (20 mg total) by mouth 3 (three) times daily.Supervising Provider is Duwayne Heck, MD REfill after 10/11/17     fexofenadine-pseudoephedrine 60-120 MG per tablet  Commonly known as:  ALLEGRA-D     vitamins/minerals Tabs

## 2017-11-15 NOTE — Discharge Summary (Signed)
DISCHARGE NOTE    Date Time: 11/15/17 9:47 AM  Patient Name: [name] [name]  Attending Physician: Rosalio Macadamia, MD    Date of Admission:   11/12/2017    Date of Discharge:   11/15/17    Reason for Admission:   Seizure-like activity [R56.9]    Problems:   Lists the present on admission hospital problems  Present on Admission:  . Seizure-like activity      Hospital Problems:  Active Problems:    Seizure-like activity    Nonspecific abnormal electroencephalogram (EEG)      Discharge Dx:   Episodes of altered consciousness    Consultations:   none    Procedures performed:   Continuous video EEG  Partial night sleep deprivation  Photic stimulation trial  Hyperventilation trial  MRI Brain [facility]  [facility] Course:   The patient is a 42 y.o. Female with a pmhx of seizures from age 36 who abruptly stopped AEDs in high school, ADHD (diagnosed pre-grad school), Asthma, and intermittent sleeplessness treated with pamelor who presents 4/29- 5/2/19to [facility] for elective admission to the Epilepsy Monitoring Unit.  Patient presents for continuous video EEG monitoring because of episodes of altered consciousness.  Patient arrived for monitoring off home AED.  Patient underwent partial night sleep deprivation on Day 1, Day 2, Day 3, where patient was not allowed to nap and was allowed to sleep between 0200 - 0600.  Partial night sleep deprivation was followed by photic stimulation and hyperventilation activation trials each morning.  Below is the summary of daily EEG reports:    CVEEG  4/29- 11/13/17  IMPRESSION AND COMMENTS : Recorded normal awake and sleep EEG No typical events noted    cvEEG 4/31- 11/14/17  IMPRESSION AND COMMENTS : Recorded normal awake and sleep EEG No typical events noted    cvEEG 5/1- 11/15/17  IMPRESSION AND COMMENTS : Recorded normal awake and sleep EEG No typical events noted    MRI Brain W WO Contrast 11/12/17  IMPRESSION:   Nonspecific white matter disease about the subcortical  white matter about the posterior frontal lobes. They are nonspecific however probably represent gliosis from prior inflammatory disease or small vessel ischemic changes, and these are more commonly seen in patient with history of hypertension, diabetes, migraine, as well as Lyme disease.      Discharge Medications:     Current Discharge Medication List      CONTINUE these medications which have NOT CHANGED    Details   albuterol (PROVENTIL HFA;VENTOLIN HFA) 108 (90 Base) MCG/ACT inhaler Inhale 2 puffs into the lungs every 4 (four) hours as needed for Wheezing.      amphetamine-dextroamphetamine (ADDERALL) 20 MG tablet Take 1 tablet (20 mg total) by mouth 3 (three) times daily.Supervising Provider is Duwayne Heck, MD  REfill after 10/11/17  Qty: 90 tablet, Refills: 0      fexofenadine-pseudoephedrine (ALLEGRA-D) 60-120 MG per tablet Take 1 tablet by mouth 2 (two) times daily.      nortriptyline (PAMELOR) 25 MG capsule Take 1 capsule (25 mg total) by mouth nightly.  Qty: 30 capsule, Refills: 1    Associated Diagnoses: Attention deficit hyperactivity disorder (ADHD), combined type      vitamins/minerals Tab Take 1 tablet by mouth daily.               Discharge Instructions:   Discontinue cvEEG  Discharge patient home    No AED recommended  Continue home medications   Follow-up with  Dr Becky Sax on 12/08/17 as an outpatient      Signed by:  Norvel Richards. Clemon Chambers, FNP-BC  Nurse Practitioner  [facility] Neu[facility]  Spectralink [phone]  [facility] Consult Spectra: [phone]  After Hours: [phone]    Please see attending Neurologist note that accompanies this mid-level encounter note.      Neurology Attending Note:    I reviewed the chart, relevant neuro-imaging and labs on the above patient.  I personally examined the patient.  I also conferred with the midlevel and agree with the findings and plan outlined above.         Kendrick Fries, M.D.  Board Certified Neurology, ABPN  Board Certified NeuroImaging, UCNS   [facility]

## 2017-11-15 NOTE — Plan of Care (Signed)
Problem: Moderate/High Fall Risk Score >5  Goal: Patient will remain free of falls  Outcome: Progressing   11/14/17 2000   OTHER   High (Greater than 13) HIGH-Activate bed/chair exit alarm where available;HIGH-Apply yellow "Fall Risk" arm band;HIGH-Initiate use of floor mats as appropriate       Problem: Neurological Deficit  Goal: Neurological status is stable or improving  Outcome: Progressing   11/14/17 0111   Goal/Interventions addressed this shift   Neurological status is stable or improving Monitor/assess/document neurological assessment (Stroke: every 4 hours);Observe for seizure activity and initiate seizure precautions if indicated;Perform CAM Assessment       Problem: Impaired Mobility  Goal: Mobility/Activity is maintained at optimal level for patient  Outcome: Progressing   11/15/17 0202   Goal/Interventions addressed this shift   Mobility/activity is maintained at optimal level for patient Maintain proper body alignment;Perform active/passive ROM;Reposition patient every 2 hours and as needed unless able to reposition self       Problem: Nutrition  Goal: Nutritional intake is adequate  Outcome: Progressing   11/15/17 0202   Goal/Interventions addressed this shift   Nutritional intake is adequate Monitor daily weights;Assist patient with meals/food selection;Encourage/perform oral hygiene as appropriate;Consult/collaborate with Speech Therapy (swallow evaluations)       Comments:   NURSING PROGRESS NOTE    Patient Name: [name] [name] (42 y.o. female)  Admission Date: 11/12/2017 [facility] Day 3)    Shift Note: AOx4, FC, MAE, VSS. C/o back pain, prn tylenol given x1 with some relief. On CVEEG, no seizure activity witnessed. OOB SBG per seizure protocol. Fall/ seizure precautions maintained.       Recent Labs  Lab 11/12/17  1606   Sodium 137   Potassium 4.2   Chloride 99*   CO2 28   BUN 11.0   Creatinine 0.7   EGFR >60.0   Glucose 121*   Calcium 9.9               Patient Lines/Drains/Airways Status    Active  Lines, Drains and Airways     Name:   Placement date:   Placement time:   Site:   Days:    Peripheral IV 11/12/17 Left Hand  11/12/17    1510    Hand    2                MEWS Score: 1    Last BM: 5/1  Pending Orders: CVEEG, SD, HV, PS  Discharge Plan: Home    Safety Checklist    Fall Precautions Y    Avasys N    Seizure Precautions Y    Aspiration Precautions Y       Interpreter Services:  Does the patient require an Interpreter? No

## 2017-11-15 NOTE — Plan of Care (Signed)
Problem: Safety  Goal: Patient will be free from injury during hospitalization  Outcome: Progressing   11/15/17 1434   Goal/Interventions addressed this shift   Patient will be free from injury during hospitalization  Assess patient's risk for falls and implement fall prevention plan of care per policy;Provide and maintain safe environment;Use appropriate transfer methods;Ensure appropriate safety devices are available at the bedside;Hourly rounding;Include patient/ family/ care giver in decisions related to safety;Provide alternative method of communication if needed (communication boards, writing)     Goal: Patient will be free from infection during hospitalization  Outcome: Progressing      Problem: Anxiety  Goal: Anxiety is at a manageable level  Outcome: Progressing

## 2017-11-15 NOTE — Progress Notes (Signed)
CASE MANAGEMENT PROGRESS NOTE      Patient: [name] [name] (42 y.o. female)  Admission Date: 11/12/2017 [facility] Day 3)    Active Hospital Problems    Diagnosis   . Nonspecific abnormal electroencephalogram (EEG)   . Seizure-like activity       Length of stay: Hospital Day 3    Disposition: Home with outpatient follow up.    Discharge plan: The patient will discharge home this afternoon wit outpatient neurology appt.     Anticipated date of discharge: Today.    Barriers to discharge: None.    Updates: None.       Dallie Piles, MSW  Care Coordinator  Case Management   [facility]  [phone]

## 2017-12-07 ENCOUNTER — Encounter (INDEPENDENT_AMBULATORY_CARE_PROVIDER_SITE_OTHER): Payer: Self-pay | Admitting: Neurology

## 2017-12-07 ENCOUNTER — Ambulatory Visit (INDEPENDENT_AMBULATORY_CARE_PROVIDER_SITE_OTHER): Payer: No Typology Code available for payment source | Admitting: Neurology

## 2017-12-07 VITALS — BP 157/97 | HR 107 | Ht 63.0 in | Wt 315.0 lb

## 2017-12-07 DIAGNOSIS — F902 Attention-deficit hyperactivity disorder, combined type: Secondary | ICD-10-CM

## 2017-12-07 DIAGNOSIS — R413 Other amnesia: Secondary | ICD-10-CM

## 2017-12-07 MED ORDER — NORTRIPTYLINE HCL 25 MG PO CAPS
25.00 mg | ORAL_CAPSULE | Freq: Every evening | ORAL | 5 refills | Status: DC
Start: 2017-12-07 — End: 2018-02-13

## 2017-12-07 MED ORDER — AMPHETAMINE-DEXTROAMPHETAMINE 20 MG PO TABS
20.00 mg | ORAL_TABLET | Freq: Three times a day (TID) | ORAL | 0 refills | Status: DC
Start: 2017-12-07 — End: 2017-12-07

## 2017-12-07 MED ORDER — AMPHETAMINE-DEXTROAMPHETAMINE 20 MG PO TABS
20.00 mg | ORAL_TABLET | Freq: Three times a day (TID) | ORAL | 0 refills | Status: DC
Start: 2017-12-07 — End: 2018-10-17

## 2017-12-07 NOTE — Progress Notes (Signed)
Subjective:           Patient ID: [name] [name] is a 42 y.o. female here for Seizure-like activity  .     Current visit:     See detailed assessment / plan for the new developments since last visit and the respective new recommendations.         Seizures        Pt referred for the evaluation of seizures. Pt moved from [place]. She has a h/o seizures and was placed on AEDs from Grade 3 to senior in high school. No seizures during this period. However pt stopped the AEDs suddenly, without doctr consent, just felt it is not needed. Also she stopped taking her ADHD meds.    Since then, pt experiencing seizure like activity, but not sure. She has episodes of time gaps, unresponsive spells. But never escalated to the grand mal seizures or LOC.     Review of Systems   Neurological: Positive for seizures.     Current Outpatient Prescriptions on File Prior to Visit   Medication Sig Dispense Refill   . albuterol (PROVENTIL HFA;VENTOLIN HFA) 108 (90 Base) MCG/ACT inhaler Inhale 2 puffs into the lungs every 4 (four) hours as needed for Wheezing.     Marland Kitchen amphetamine-dextroamphetamine (ADDERALL) 20 MG tablet Take 1 tablet (20 mg total) by mouth 3 (three) times daily.Supervising Provider is Duwayne Heck, MD  REfill after 10/11/17 90 tablet 0   . fexofenadine-pseudoephedrine (ALLEGRA-D) 60-120 MG per tablet Take 1 tablet by mouth 2 (two) times daily.     . nortriptyline (PAMELOR) 25 MG capsule Take 1 capsule (25 mg total) by mouth nightly. (Patient taking differently: Take 25 mg by mouth once as needed    ) 30 capsule 1   . vitamins/minerals Tab Take 1 tablet by mouth daily.       No current facility-administered medications on file prior to visit.          All systems were reviewed and were negative except as described in the HPI.    Family h/o: reviewed not contributory  Past h/o as in assessment  Social h/o: Denies smoking or drugs. Takes ETOH occasionally and socially.            Objective:      Physical Exam   Neurologic  Exam      Vital Signs:  Reviewed    General: The patient was well developed and well nourished.  No acute distress. Cooperative with the exam  Neck:  no carotid bruits  CVS: RRR, no murmurs, rubs,or gallops  Resp: CTA bilaterally, no retractions  Abd: Soft, nontender  Extremities: no pedal edema, extremities normal in color    Mental Status: The patient was awake, alert and oriented to person, place, and time.  Affect is normal  Fund of knowledge appropriate  Recent and remote memory are intact   Attention span and concentration appear normal.  Language function is normal. There is no evidence of aphasia in conversational speech.    Fundi: no papilledema  Neck: NO occipital nerve  tenderness. ROM appear normal    Cranial nerves:   -CN II: Visual fields full to bedside confrontation   -CN III, IV, VI: Pupils equal, round, and reactive to light; extraocular movements intact; no ptosis              -CN V: Facial sensation intact in V1 through V3 distributions   -CN VII: Face symmetric   -CN VIII: Hearing intact  to conversational speech   -CN IX, X: Palate elevates symmetrically; normal phonation   -CN XI: Symmetric full strength of sternocleidomastoid and trapezius muscles   -CN XII: Tongue protrudes midline    Motor: Muscle tone normal without spasticity or flaccidity. No atrophy.  No fasiculations. No pronator drift.  Strength  R / L    R / L  Deltoid  5 / 5  Hip Flexion 5 / 5  Triceps  5 / 5   Hip extension 5 / 5  Biceps  5 / 5   Knee flexion 5 / 5  Wrist ext 5 / 5  Knee ext 5 / 5  Wrist flexion 5 / 5  Dorsiflexion 5 / 5  FF   5 / 5  Plantar flexion 5 / 5    Sensory:   Light touch intact.  Pinprick intact.  Temperature intact.  Vibration intact.  Proprioception intact.    Reflexes:  R / L     R / L  Biceps  2 / 2  Knees  2 / 2  Triceps 2 / 2  Ankles  2 / 2  BR                   2 / 2  Plantars Flexor / Flexor    Coordination: FTN and HKS intact, no truncal ataxia. RAMs intact. No tremors    Gait: Station normal,  gait stable   Tandem walk intact.   Romberg negative.            Assessment:       H/o seizures since age 34.   Treated with AEDs from age 19 to 69.  Seizures with LOA and staring/ unresponsive spells  Currently off AEDs > 10 years  Recurrent seizure like activity  Also diagnosed ADHD on meds  Epilepsy risk factors: Childhood seizures, No TBI/Encephalitis, No family h/o seizures  MRI/EEG done in the past > 10 years. Results N/A  EMU May 2019:recorded normal awake and sleep EEG.    Dec 07, 2017 visit:since last visit.  Patient had a EMU done.2 hours.  EMU did not show any typical events of seizure evidence.sowed no AEDs recommended.  Patient is currently on attention deficit/hyperactivity disorder 60 mg Adderall daily.  That has been working perfectly well for her.  Patient has a neuropsychological assessment done in [place].  She will bring the neuropsychology report to scanned in the chart.  Will do neuropsychology assessment again if report is not available. Pamelor 25 mg daily helping her.  Will continue that.      Recommend:    Adderall 20 mg 3 times a day  Pamelor 25 mg daily  Neuropsychology referral  Asked to bring the neuropsychology report from the previous providers to confirm the diagnosis of attention deficit/hyperactivity disorder.  If no report available.  Attention deficit/hyperactivity disorder medications will not be prescribed            Plan:      No orders of the defined types were placed in this encounter.          As above.     Additional notes and data scanned including patient questionnaire which may contain pertinent information to visit. Patient can follow up sooner if needed. In the meantime, patient will contact the office with any questions or concerns.       Counseled the patient re: spent explaining the natural history of seizures, types of seizures, triggers for seizures.  Need for the necessary testing.  EEG and MRI.  Need for a seizure medication compliance for medications.  Seizure  precautions including avoid driving per [place].  6 months from the last seizure.  Side effects of seizure medications.  Avoiding triggers for seizures.  Backup plan, and the time of partial seizure and convulsive seizures.  Including going to the emergency room.  Taking emergency rescue medications.           Rosalio Macadamia, MD - [place]  NEUROLOGY  Available on [employer] paging  Board Certified in Neurology by ABPN  Board Certified Clinical Neurophysiology by ABPN     [address] [place]  [place] [id number]  T [phone] F [phone]       [email or web address]      This note was generated by the Epic EMR system/ Dragon speech recognition and may contain inherent errors or omissions not intended by the user. Grammatical errors, random word insertions, deletions, pronoun errors and incomplete sentences are occasional consequences of this technology due to software limitations. Not all errors are caught or corrected.Although every attempt is made to root out erroneus and incomplete transcription, the note may still not fully represent the intent or opinion of the author. If there are questions or concerns about the content of this note or information contained within the body of this dictation they should be addressed directly with the author for clarification.*      Rosalio Macadamia, MD

## 2018-01-04 ENCOUNTER — Encounter (INDEPENDENT_AMBULATORY_CARE_PROVIDER_SITE_OTHER): Payer: Self-pay | Admitting: Neurology

## 2018-02-13 ENCOUNTER — Encounter (INDEPENDENT_AMBULATORY_CARE_PROVIDER_SITE_OTHER): Payer: Self-pay | Admitting: Physician Assistant

## 2018-02-13 ENCOUNTER — Ambulatory Visit (INDEPENDENT_AMBULATORY_CARE_PROVIDER_SITE_OTHER): Payer: No Typology Code available for payment source | Admitting: Physician Assistant

## 2018-02-13 DIAGNOSIS — F902 Attention-deficit hyperactivity disorder, combined type: Secondary | ICD-10-CM

## 2018-02-13 MED ORDER — ALBUTEROL SULFATE HFA 108 (90 BASE) MCG/ACT IN AERS
2.0000 | INHALATION_SPRAY | RESPIRATORY_TRACT | 3 refills | Status: DC | PRN
Start: 2018-02-13 — End: 2018-06-17

## 2018-02-13 MED ORDER — NORTRIPTYLINE HCL 25 MG PO CAPS
25.00 mg | ORAL_CAPSULE | Freq: Every evening | ORAL | 5 refills | Status: DC
Start: 2018-02-13 — End: 2019-01-23

## 2018-02-13 MED ORDER — ATOMOXETINE HCL 80 MG PO CAPS
80.00 mg | ORAL_CAPSULE | Freq: Every day | ORAL | 5 refills | Status: DC
Start: 2018-02-13 — End: 2019-05-07

## 2018-02-13 NOTE — Progress Notes (Signed)
Have you seen any specialists/other providers since your last visit with us?    No    Arm preference verified?   Yes    The patient is due for mammogram and pap smear

## 2018-02-13 NOTE — Progress Notes (Signed)
Subjective:      Patient ID: [name] [name]  is a 42 y.o.  female.     Chief Complaint   Patient presents with   . ADHD     Follow up.   . Medication Request     atomoxetine 80 mg capsule to take 1 capsule daily.      HPI  [name] [name] presents to the office today to discuss ADHD.  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   Psychiatric/Behavioral: Positive for decreased concentration. Negative for behavioral problems and dysphoric mood. The patient is hyperactive. The patient is not nervous/anxious.          BP 128/83 (BP Site: Right arm, Patient Position: Sitting, Cuff Size: Large)   Pulse 93   Temp 98.1 F (36.7 C) (Oral)   Wt 144.2 kg (318 lb)   LMP 01/17/2018 (Approximate)   BMI 56.33 kg/m     Objective:   Physical Exam     Assessment:   1. Attention deficit hyperactivity disorder (ADHD), combined type  - nortriptyline (PAMELOR) 25 MG capsule; Take 1 capsule (25 mg total) by mouth nightly  Dispense: 30 capsule; Refill: 5  - atomoxetine (STRATTERA) 80 MG capsule; Take 1 capsule (80 mg total) by mouth daily  Dispense: 30 capsule; Refill: 5         Plan:   Refilled above x 6 months  FU 6 months  Call with any concerns/questions    Risk & Benefits of any new medication(s) were explained to the patient who verbalized understanding & agreed to the treatment plan.     Call if symptoms persist, worsen, or change.  Call with updates/questions/concerns.    Beatris Si, MPH

## 2018-02-22 ENCOUNTER — Encounter (INDEPENDENT_AMBULATORY_CARE_PROVIDER_SITE_OTHER): Payer: Self-pay

## 2018-03-25 ENCOUNTER — Encounter (INDEPENDENT_AMBULATORY_CARE_PROVIDER_SITE_OTHER): Payer: Self-pay

## 2018-05-07 ENCOUNTER — Encounter (INDEPENDENT_AMBULATORY_CARE_PROVIDER_SITE_OTHER): Payer: Self-pay

## 2018-05-29 ENCOUNTER — Encounter (INDEPENDENT_AMBULATORY_CARE_PROVIDER_SITE_OTHER): Payer: Self-pay

## 2018-06-05 ENCOUNTER — Encounter (INDEPENDENT_AMBULATORY_CARE_PROVIDER_SITE_OTHER): Payer: Self-pay

## 2018-06-17 ENCOUNTER — Other Ambulatory Visit (INDEPENDENT_AMBULATORY_CARE_PROVIDER_SITE_OTHER): Payer: Self-pay | Admitting: Physician Assistant

## 2018-06-17 MED ORDER — ALBUTEROL SULFATE HFA 108 (90 BASE) MCG/ACT IN AERS
2.0000 | INHALATION_SPRAY | RESPIRATORY_TRACT | 3 refills | Status: DC | PRN
Start: 2018-06-17 — End: 2019-01-31

## 2018-06-17 NOTE — Telephone Encounter (Signed)
LOV 02/13/18 DxAttention deficit hyperactivity disorder (ADHD), combined type.

## 2018-06-24 ENCOUNTER — Encounter (INDEPENDENT_AMBULATORY_CARE_PROVIDER_SITE_OTHER): Payer: Self-pay

## 2018-07-03 ENCOUNTER — Encounter (INDEPENDENT_AMBULATORY_CARE_PROVIDER_SITE_OTHER): Payer: Self-pay

## 2018-07-24 ENCOUNTER — Encounter (INDEPENDENT_AMBULATORY_CARE_PROVIDER_SITE_OTHER): Payer: Self-pay

## 2018-07-25 ENCOUNTER — Encounter (INDEPENDENT_AMBULATORY_CARE_PROVIDER_SITE_OTHER): Payer: Self-pay

## 2018-08-28 ENCOUNTER — Encounter (INDEPENDENT_AMBULATORY_CARE_PROVIDER_SITE_OTHER): Payer: Self-pay

## 2018-09-17 ENCOUNTER — Encounter (INDEPENDENT_AMBULATORY_CARE_PROVIDER_SITE_OTHER): Payer: Self-pay

## 2018-09-22 ENCOUNTER — Encounter (INDEPENDENT_AMBULATORY_CARE_PROVIDER_SITE_OTHER): Payer: Self-pay

## 2018-09-23 ENCOUNTER — Encounter (INDEPENDENT_AMBULATORY_CARE_PROVIDER_SITE_OTHER): Payer: Self-pay

## 2018-10-08 ENCOUNTER — Encounter (INDEPENDENT_AMBULATORY_CARE_PROVIDER_SITE_OTHER): Payer: Self-pay

## 2018-10-17 ENCOUNTER — Telehealth (INDEPENDENT_AMBULATORY_CARE_PROVIDER_SITE_OTHER): Payer: No Typology Code available for payment source | Admitting: Family Medicine

## 2018-10-17 ENCOUNTER — Encounter (INDEPENDENT_AMBULATORY_CARE_PROVIDER_SITE_OTHER): Payer: Self-pay | Admitting: Family Medicine

## 2018-10-17 DIAGNOSIS — J0100 Acute maxillary sinusitis, unspecified: Secondary | ICD-10-CM

## 2018-10-17 MED ORDER — AMOXICILLIN 875 MG PO TABS
875.00 mg | ORAL_TABLET | Freq: Two times a day (BID) | ORAL | 0 refills | Status: AC
Start: 2018-10-17 — End: 2018-10-27

## 2018-10-17 MED ORDER — FLUTICASONE PROPIONATE HFA 44 MCG/ACT IN AERO
2.00 | INHALATION_SPRAY | Freq: Two times a day (BID) | RESPIRATORY_TRACT | 5 refills | Status: DC
Start: 2018-10-17 — End: 2019-05-15

## 2018-10-17 NOTE — Progress Notes (Signed)
Subjective:      Patient ID: [name] [name] is a 43 y.o. female     Chief Complaint   Patient presents with    Sinus Problem     sinus pain and pressure yellowish mucus discharge, denies cough, or fever symptoms x 62month        Sinus Problem   This is a recurrent problem. The current episode started in the past 7 days. The problem has been gradually worsening since onset. There has been no fever. The pain is moderate. Associated symptoms include congestion, coughing, ear pain, headaches and sinus pressure. Pertinent negatives include no chills or shortness of breath. Past treatments include oral decongestants (mucinex). The treatment provided moderate relief.   She reports that she gets sinus infections around this time of the year fairly regularly and she is trying otc medications but they are only helping some and symptoms are worsening over past 2 days.        The following portions of the patient's history were reviewed and updated as appropriate: allergies, current medications, past family history, past medical history, past social history, past surgical history and problem list.    Review of Systems   Constitutional: Negative for chills, fever and malaise/fatigue.   HENT: Positive for congestion, ear pain, sinus pressure and sinus pain.    Respiratory: Positive for cough. Negative for sputum production and shortness of breath.    Gastrointestinal: Negative for diarrhea, nausea and vomiting.   Neurological: Positive for headaches.            LMP 09/19/2018    Objective:   Physical Exam   Constitutional: She is oriented to person, place, and time and well-developed, well-nourished, and in no distress. No distress.   HENT:   Head: Atraumatic.   Neurological: She is alert and oriented to person, place, and time.   Psychiatric: Mood, memory, affect and judgment normal.      Physical exam limited secondary to tele visit    Assessment and Plan:   1. Acute non-recurrent maxillary sinusitis  - amoxicillin (AMOXIL) 875  MG tablet; Take 1 tablet (875 mg total) by mouth 2 (two) times daily for 10 days  Dispense: 20 tablet; Refill: 0  - fluticasone (FLOVENT HFA) 44 MCG/ACT inhaler; Inhale 2 puffs into the lungs 2 (two) times daily  Dispense: 1 Inhaler; Refill: 5     - Supportive care including alternating tylenol and ibuprofen (motrin) every 4-6 hours and good hydration along with nasal saline  - RTC or call if symptoms worsen, persist, or new symptoms appear     Shellia Cleverly, DO

## 2018-10-23 ENCOUNTER — Encounter (INDEPENDENT_AMBULATORY_CARE_PROVIDER_SITE_OTHER): Payer: Self-pay

## 2018-10-24 ENCOUNTER — Encounter (INDEPENDENT_AMBULATORY_CARE_PROVIDER_SITE_OTHER): Payer: Self-pay

## 2018-11-22 ENCOUNTER — Encounter (INDEPENDENT_AMBULATORY_CARE_PROVIDER_SITE_OTHER): Payer: Self-pay

## 2018-11-25 ENCOUNTER — Encounter (INDEPENDENT_AMBULATORY_CARE_PROVIDER_SITE_OTHER): Payer: Self-pay

## 2018-12-23 ENCOUNTER — Encounter (INDEPENDENT_AMBULATORY_CARE_PROVIDER_SITE_OTHER): Payer: Self-pay

## 2018-12-24 ENCOUNTER — Encounter (INDEPENDENT_AMBULATORY_CARE_PROVIDER_SITE_OTHER): Payer: Self-pay

## 2018-12-31 ENCOUNTER — Encounter (INDEPENDENT_AMBULATORY_CARE_PROVIDER_SITE_OTHER): Payer: Self-pay

## 2019-01-22 ENCOUNTER — Emergency Department: Payer: 59

## 2019-01-22 ENCOUNTER — Encounter (INDEPENDENT_AMBULATORY_CARE_PROVIDER_SITE_OTHER): Payer: Self-pay

## 2019-01-22 ENCOUNTER — Emergency Department
Admission: EM | Admit: 2019-01-22 | Discharge: 2019-01-22 | Disposition: A | Discharge status: Home / Self Care | Payer: 59 | Attending: Emergency Medicine | Admitting: Emergency Medicine

## 2019-01-22 DIAGNOSIS — R002 Palpitations: Secondary | ICD-10-CM | POA: Insufficient documentation

## 2019-01-22 DIAGNOSIS — D72829 Elevated white blood cell count, unspecified: Secondary | ICD-10-CM | POA: Insufficient documentation

## 2019-01-22 DIAGNOSIS — Z03818 Encounter for observation for suspected exposure to other biological agents ruled out: Secondary | ICD-10-CM | POA: Insufficient documentation

## 2019-01-22 DIAGNOSIS — Z20822 Contact with and (suspected) exposure to covid-19: Secondary | ICD-10-CM

## 2019-01-22 LAB — CBC AND DIFFERENTIAL
Absolute NRBC: 0 10*3/uL (ref 0.00–0.00)
Basophils Absolute Automated: 0.05 10*3/uL (ref 0.00–0.08)
Basophils Automated: 0.4 %
Eosinophils Absolute Automated: 0.04 10*3/uL (ref 0.00–0.44)
Eosinophils Automated: 0.3 %
Hematocrit: 41 % (ref 34.7–43.7)
Hgb: 13.1 g/dL (ref 11.4–14.8)
Immature Granulocytes Absolute: 0.32 10*3/uL — ABNORMAL HIGH (ref 0.00–0.07)
Immature Granulocytes: 2.3 %
Lymphocytes Absolute Automated: 2.53 10*3/uL (ref 0.42–3.22)
Lymphocytes Automated: 18.1 %
MCH: 26.5 pg (ref 25.1–33.5)
MCHC: 32 g/dL (ref 31.5–35.8)
MCV: 83 fL (ref 78.0–96.0)
MPV: 9.8 fL (ref 8.9–12.5)
Monocytes Absolute Automated: 1 10*3/uL — ABNORMAL HIGH (ref 0.21–0.85)
Monocytes: 7.2 %
Neutrophils Absolute: 10.02 10*3/uL — ABNORMAL HIGH (ref 1.10–6.33)
Neutrophils: 71.7 %
Nucleated RBC: 0 /100 WBC (ref 0.0–0.0)
Platelets: 325 10*3/uL (ref 142–346)
RBC: 4.94 10*6/uL (ref 3.90–5.10)
RDW: 15 % (ref 11–15)
WBC: 13.96 10*3/uL — ABNORMAL HIGH (ref 3.10–9.50)

## 2019-01-22 LAB — URINALYSIS REFLEX TO MICROSCOPIC EXAM - REFLEX TO CULTURE
Bilirubin, UA: NEGATIVE
Blood, UA: NEGATIVE
Glucose, UA: NEGATIVE
Ketones UA: NEGATIVE
Leukocyte Esterase, UA: NEGATIVE
Nitrite, UA: NEGATIVE
Protein, UR: NEGATIVE
Specific Gravity UA: 1.015 (ref 1.001–1.035)
Urine pH: 7 (ref 5.0–8.0)
Urobilinogen, UA: 0.2 mg/dL (ref 0.2–2.0)

## 2019-01-22 LAB — COMPREHENSIVE METABOLIC PANEL
ALT: 39 U/L (ref 0–55)
AST (SGOT): 20 U/L (ref 5–34)
Albumin/Globulin Ratio: 1.2 (ref 0.9–2.2)
Albumin: 4.2 g/dL (ref 3.5–5.0)
Alkaline Phosphatase: 76 U/L (ref 37–106)
Anion Gap: 15 (ref 5.0–15.0)
BUN: 6 mg/dL — ABNORMAL LOW (ref 7.0–19.0)
Bilirubin, Total: 0.4 mg/dL (ref 0.2–1.2)
CO2: 27 mEq/L (ref 22–29)
Calcium: 9.8 mg/dL (ref 8.5–10.5)
Chloride: 99 mEq/L — ABNORMAL LOW (ref 100–111)
Creatinine: 0.8 mg/dL (ref 0.6–1.0)
Globulin: 3.4 g/dL (ref 2.0–3.6)
Glucose: 151 mg/dL — ABNORMAL HIGH (ref 70–100)
Potassium: 4.1 mEq/L (ref 3.5–5.1)
Protein, Total: 7.6 g/dL (ref 6.0–8.3)
Sodium: 141 mEq/L (ref 136–145)

## 2019-01-22 LAB — TSH: TSH: 3 u[IU]/mL (ref 0.35–4.94)

## 2019-01-22 LAB — ECG 12-LEAD
Atrial Rate: 118 {beats}/min
P Axis: 53 degrees
P-R Interval: 146 ms
Q-T Interval: 326 ms
QRS Duration: 96 ms
QTC Calculation (Bezet): 456 ms
R Axis: 19 degrees
T Axis: 46 degrees
Ventricular Rate: 118 {beats}/min

## 2019-01-22 LAB — T4, FREE: T4 Free: 1.17 ng/dL (ref 0.70–1.48)

## 2019-01-22 LAB — IHS D-DIMER: D-Dimer: <0.27 ug/mL FEU (ref 0.00–0.50)

## 2019-01-22 LAB — GFR: EGFR: >60

## 2019-01-22 LAB — LIPASE: Lipase: 44 U/L (ref 8–78)

## 2019-01-22 LAB — URINE HCG QUALITATIVE: Urine HCG Qualitative: NEGATIVE

## 2019-01-22 LAB — TROPONIN I: Troponin I: 0.03 ng/mL (ref 0.00–0.05)

## 2019-01-22 MED ORDER — SODIUM CHLORIDE 0.9 % IV BOLUS
1000.00 mL | Freq: Once | INTRAVENOUS | Status: AC
Start: 2019-01-22 — End: 2019-01-22
  Administered 2019-01-22: 09:00:00 1000 mL via INTRAVENOUS

## 2019-01-22 NOTE — ED Notes (Signed)
Pt is aware a urine sample is needed.  Pt stated I can't go right now.  Pt verbalizes understanding need for urine and will attempt again after fluids.

## 2019-01-22 NOTE — Discharge Instructions (Signed)
Palpitations    You have been diagnosed with "palpitations."    Palpitations are beats in the chest that feel funny or strange. They are often caused by extra heartbeats that come earlier than normal. These are either "premature atrial contractions" or "premature ventricular contractions," depending on where in the heart they happen. Palpitations often go away on their own and often cause no serious problems. They are sometimes related to stress or lack of sleep. They can also be related too much caffeine. These symptoms can be caused by many over-the-counter (no prescription needed) cold medicines, diet pills and "natural" vitamin supplements with stimulants, often ephedrine (Ephedrine is also known by its traditional [place] name, Ma huang).    Palpitations feel different for different people. Some patients describe a feeling of "butterflies" in the chest. Others say it feels like the heart is "flipping over" in the chest. Palpitations may happen often but should last only a second or two each time. They should not cause any chest pain, lightheadedness, dizziness or fainting.    There is no specific treatment for palpitations. However, you should avoid all caffeine and cold medications. Also avoid all natural stimulants and chocolate.    Follow up with your primary doctor in the next week to make sure your symptoms are getting better. In some cases, a Holter monitor may be ordered. A Holter monitor is a portable heart monitor. It records your heart's electrical rhythm. You will need to see your regular doctor to get the results from the test.    YOU SHOULD SEEK MEDICAL ATTENTION IMMEDIATELY, EITHER HERE OR AT THE NEAREST EMERGENCY DEPARTMENT, IF ANY OF THE FOLLOWING OCCURS:   Lightheadedness or the feeling you might faint.   An unusually fast or slow heart rate.   Chest pain or shortness of breath.   Palpitations increase when you exercise.   Any other worsening symptoms or concerns.        Follow-up  Steps for Patients with Pending COVID-19 Testing - December 17, 2018     Thank you for choosing [facility]. During your visit, you received a COVID-19 test. You should receive your results in 3-5 days.    When will I know the results of my COVID-19 test?  There are multiple ways to see your COVID-19 test results after the 3-5 day waiting period. Please ensure we have your most up to date contact information on file before you leave your appointment.    1. The easiest way to find test results done at an [facility] care site is through your MyChart account, [facility] online patient portal. This is also a good way to connect with your [facility] primary care team for continued care. If you do not already have MyChart activated, you will be given an activation code at the time of testing. Please note that MyChart is unavailable for children age 92-17.    2. Your Primary Care Physician, the physician who ordered your test or another [facility] team member may call you with your test results.    3. If youre having difficulty finding your test results, or if you have additional questions, please contact the COVID-19 Test Results Call Center at [phone] for further instructions. The call center is open daily from 8am - 8pm.    If you need help activating your MyChart account, please call [phone]. You can download the MyChart application to your phone using the QR codes below:  What should I do while I wait for my test results?   Take good care of yourself and be mindful of the safety of those around you.   Rest and stay well hydrated.   Use acetaminophen (Tylenol) to help manage fever.   Wash your hands often.   Stay at home except to receive medical care.   Call ahead before seeing your doctor, medical provider, or when seeking medical care at any care site.   Isolate yourself from others as much as possible.   Clean all "high touch" surfaces daily--such as tabletops, handles/doorknobs, and  phones,    Instructions continue on next page     Most people with COVID-19 will get better; however, a small number may require more treatment. Please contact your doctor or present to the nearest Emergency Department if you develop the warning signs of more severe infection such as:    ? A fever over 102 ?F not controlled by acetaminophen (Tylenol)  ? Shortness of breath or chest pain  ? Worsening cough  ? Increasing weakness  ? Inability to tolerate oral fluids    Some general things to think about:   If you have a medical emergency and need to call 911, notify the person answering the phone that you may have COVID-19. If possible, put on a facemask before the ambulance arrives.     Persons placed under active monitoring or facilitated self-monitoring should follow instructions provided by their local health department or occupational health professionals, as appropriate.    When can I stop self-isolation?  Until you receive your results, you should remain home and avoid contact with others (self-isolation).  The decision to stop isolation precautions should be made on a case-by-case basis, in consultation with healthcare providers and state and local health departments.     If your test result is negative, you should still self-isolate until 3 days after your symptoms stop.   If your test result is positive, you should stay home for at least 10 days from the day your symptoms started. If you are still having symptoms at the end of 10 days, continue in-home isolation until 3 days after your symptoms stop. Talk to your healthcare provider to determine what follow-up is needed.  You can also schedule telemedicine visits with your primary care physician to discuss ongoing symptoms or new concerns that may arise.    For further information, please consult the following websites:  Centers for Disease Control and Prevention (CDC)  MoralGame.si.html    [place]  Department of Health  http://[place].org/    If you are ill and need to see a healthcare provider, but your primary physician is not available, the [facility] are open for walk in visits.  Open Monday - Friday, 8am - 8pm at the following locations:   [facility]: [address] [place], [place] [address], [phone]   [facility]: [address] [place], [place] [address], [phone]    Open Daily 8am - 8pm including weekends at the following location:   [facility]: [address] [place], [place] [address], [phone]      All [facility] can care for patients with all conditions, including COVID-19.  Do not hesitate to seek care should you have a healthcare emergency.

## 2019-01-22 NOTE — ED Provider Notes (Signed)
EMERGENCY DEPARTMENT HISTORY AND PHYSICAL EXAM     None        Date: 01/22/2019  Patient Name: [name] [name]    History of Presenting Illness     Chief Complaint   Patient presents with    Palpitations       History Provided By: Patient    Chief Complaint: palpitations  Duration: three days  Timing:  Constant  Location: chest  Quality: uncomfortable  Severity: Moderate  Exacerbating factors: none   Alleviating factors: none  Associated Symptoms: 35lb weight gain over 3 months, dry mouth, epigastric "tightness"  Pertinent Negatives: fever, chest pain, SOB, URI symptoms, n/v/d, early satiety, change in eating habits    Additional History: [name] [name] is a 43 y.o. female presenting to the ED with palpitations x 3 days. The patient states she noticed her resting HR has increased from the 70s in the past to the 90s, with the highest reading being 109bpm at home. She denies any CP, SOB, leg swelling, syncope. She notes significant weight gain over the last 3 months, despite a lack of change in her routine. She denies any hx of similar symptoms.     PCP: Roe Rutherford, PA  SPECIALISTS:    No current facility-administered medications for this encounter.      Current Outpatient Medications   Medication Sig Dispense Refill    albuterol (PROVENTIL HFA;VENTOLIN HFA) 108 (90 Base) MCG/ACT inhaler Inhale 2 puffs into the lungs every 4 (four) hours as needed for Wheezing 1 each 3    atomoxetine (STRATTERA) 80 MG capsule Take 1 capsule (80 mg total) by mouth daily 30 capsule 5    fexofenadine (ALLEGRA) 180 MG tablet Take 180 mg by mouth daily      fluticasone (FLOVENT HFA) 44 MCG/ACT inhaler Inhale 2 puffs into the lungs 2 (two) times daily 1 Inhaler 5    nortriptyline (PAMELOR) 25 MG capsule Take 1 capsule (25 mg total) by mouth nightly 30 capsule 5    vitamins/minerals Tab Take 1 tablet by mouth daily.         Past History     Past Medical History:  Past Medical History:   Diagnosis Date    Asthma      Attention deficit hyperactivity disorder (ADHD)     Convulsions     Seasonal allergic rhinitis        Past Surgical History:  Past Surgical History:   Procedure Laterality Date    THUMB, BASILAR SCOPE WITH DEBRIDEMENT AND LIGAMENT RECONSTRUCTION Left 2015       Family History:  Family History   Problem Relation Age of Onset    Cancer Mother         breast cancer    Lymphoma Mother     Heart disease Father     Cancer Maternal Grandmother         breast cancer       Social History:  Social History     Tobacco Use    Smoking status: Never Smoker    Smokeless tobacco: Never Used   Substance Use Topics    Alcohol use: No    Drug use: No       Allergies:  Allergies   Allergen Reactions    Dust Mite Extract     Mold Extract [Trichophyton]     Prednisone Other (See Comments)     Mood changes  ICS- OK        Review of Systems  Review of Systems   Constitutional: Negative for fever.   Respiratory: Negative for shortness of breath.    Cardiovascular: Positive for palpitations. Negative for chest pain and leg swelling.   Gastrointestinal: Positive for abdominal distention. Negative for diarrhea, nausea and vomiting.   Genitourinary: Negative for flank pain.   Neurological: Negative for syncope.   All other systems reviewed and are negative.      Physical Exam   BP 164/88    Pulse 98    Temp 99.1 F (37.3 C)    Resp 16    Ht 5\' 3"  (1.6 m)    Wt 152 kg    LMP 12/23/2018    SpO2 96%    BMI 59.34 kg/m     Physical Exam   Constitutional: She is oriented to person, place, and time. She appears well-developed and well-nourished.   HENT:   Head: Normocephalic and atraumatic.   Eyes: Pupils are equal, round, and reactive to light. EOM are normal.   Neck: Normal range of motion. Neck supple.   Cardiovascular: Normal rate, regular rhythm and normal heart sounds.   Pulmonary/Chest: Effort normal and breath sounds normal. She has no wheezes.   Abdominal: Soft. There is no abdominal tenderness.   Obese.   Mild TTP  epigastric area   Musculoskeletal: Normal range of motion.         General: No tenderness or edema.   Neurological: She is alert and oriented to person, place, and time.   Skin: Skin is warm and dry.   Psychiatric: She has a normal mood and affect. Her behavior is normal.   Nursing note and vitals reviewed.      Diagnostic Study Results     Labs -     Results     Procedure Component Value Units Date/Time    UA Reflex to Micro - Reflex to Culture [[id number]] Collected:  01/22/19 0909     Updated:  01/22/19 0925     Urine Type Urine, Clean Ca     Color, UA Yellow     Clarity, UA Clear     Specific Gravity UA 1.015     Urine pH 7.0     Leukocyte Esterase, UA Negative     Nitrite, UA Negative     Protein, UR Negative     Glucose, UA Negative     Ketones UA Negative     Urobilinogen, UA 0.2 mg/dL      Bilirubin, UA Negative     Blood, UA Negative     RBC, UA 0-2 /hpf      WBC, UA 0-5 /hpf      Squamous Epithelial Cells, Urine 0-5 /hpf     Narrative:       Replace urinary catheter prior to obtaining the urine culture  if it has been in place for greater than or equal to 14  days:->N/A No Foley  Indications for U/A Reflex to Micro - Reflex to  Culture:->Suprapubic Pain/Tenderness or Dysuria    Urine HCG Qualitative [[id number]] Collected:  01/22/19 0909    Specimen:  Urine Updated:  01/22/19 0925     Urine HCG Qualitative Negative    Narrative:       Replace urinary catheter prior to obtaining the urine culture  if it has been in place for greater than or equal to 14  days:->N/A No Foley  Indications for U/A Reflex to Micro - Reflex to  Culture:->Suprapubic Pain/Tenderness or Dysuria    D-Dimer [[id number]]  Collected:  01/22/19 0823     Updated:  01/22/19 0908     D-Dimer <0.27 ug/mL FEU     Troponin I [[id number]] Collected:  01/22/19 0823    Specimen:  Blood Updated:  01/22/19 0901     Troponin I 0.03 ng/mL     CBC and differential [[id number]]  (Abnormal) Collected:  01/22/19 0823    Specimen:  Blood Updated:  01/22/19 0900      WBC 13.96 x10 3/uL      Hgb 13.1 g/dL      Hematocrit 78.2 %      Platelets 325 x10 3/uL      RBC 4.94 x10 6/uL      MCV 83.0 fL      MCH 26.5 pg      MCHC 32.0 g/dL      RDW 15 %      MPV 9.8 fL      Neutrophils 71.7 %      Lymphocytes Automated 18.1 %      Monocytes 7.2 %      Eosinophils Automated 0.3 %      Basophils Automated 0.4 %      Immature Granulocytes 2.3 %      Nucleated RBC 0.0 /100 WBC      Neutrophils Absolute 10.02 x10 3/uL      Lymphocytes Absolute Automated 2.53 x10 3/uL      Monocytes Absolute Automated 1.00 x10 3/uL      Eosinophils Absolute Automated 0.04 x10 3/uL      Basophils Absolute Automated 0.05 x10 3/uL      Immature Granulocytes Absolute 0.32 x10 3/uL      Absolute NRBC 0.00 x10 3/uL     Lipase [[id number]] Collected:  01/22/19 0823    Specimen:  Blood Updated:  01/22/19 0859     Lipase 44 U/L     GFR [[id number]] Collected:  01/22/19 0823     Updated:  01/22/19 0859     EGFR >60.0    Comprehensive metabolic panel [[id number]]  (Abnormal) Collected:  01/22/19 0823    Specimen:  Blood Updated:  01/22/19 0859     Glucose 151 mg/dL      BUN 6.0 mg/dL      Creatinine 0.8 mg/dL      Sodium 010 mEq/L      Potassium 4.1 mEq/L      Chloride 99 mEq/L      CO2 27 mEq/L      Calcium 9.8 mg/dL      Protein, Total 7.6 g/dL      Albumin 4.2 g/dL      AST (SGOT) 20 U/L      ALT 39 U/L      Alkaline Phosphatase 76 U/L      Bilirubin, Total 0.4 mg/dL      Globulin 3.4 g/dL      Albumin/Globulin Ratio 1.2     Anion Gap 15.0    TSH [[id number]] Collected:  01/22/19 0823    Specimen:  Blood Updated:  01/22/19 0823    T4, free [[id number]] Collected:  01/22/19 0823    Specimen:  Blood Updated:  01/22/19 0823          Radiologic Studies -   Radiology Results (24 Hour)     Procedure Component Value Units Date/Time    Chest 2 Views [[id number]] Collected:  01/22/19 0845    Order Status:  Completed Updated:  01/22/19 0850    Narrative:  INDICATION: Palpitations.    TECHNIQUE: PA and lateral radiographs of  the chest.    COMPARISON: None.    FINDINGS: Cardiomediastinal silhouette is normal in size and  appearance.  Lungs are clear. Pleural spaces are clear. No pneumothorax.   Upper abdomen and regional osseous structures demonstrate no acute  abnormality.      Impression:           No acute thoracic process.     Aldean Ast, MD   01/22/2019 8:48 AM      .    Medical Decision Making   I am the first provider for this patient.    I reviewed the vital signs, available nursing notes, past medical history, past surgical history, family history and social history.    Vital Signs-Reviewed the patient's vital signs.     Patient Vitals for the past 12 hrs:   BP Temp Pulse Resp   01/22/19 0952 164/88 -- 98 16   01/22/19 0950 -- -- 100 --   01/22/19 0852 -- -- (!) 103 17   01/22/19 0843 168/79 -- (!) 104 18   01/22/19 0825 -- -- (!) 108 16   01/22/19 0803 (!) 185/101 99.1 F (37.3 C) (!) 116 15       Pulse Oximetry Analysis - Normal 96% on RA    Cardiac Monitor:  Rate: 116bpm  Rhythm:  Sinus Tachycardia     EKG:  Interpreted by the EP.   Time Interpreted: 8am   Rate: 118bpm   Rhythm: Sinus Tachycardia    Interpretation: no acute ischemia   Comparison: No prior study is available for comparison.    Clinical Decision Support:   Heart Score      Value   History  0   EKG  1   Risk Factors  0   Total (with age)  1   Onset of pain (time of START of last episode of chest pain)?  >6 hrs ago          Old Medical Records: Old medical records.  Nursing notes.     ED Course:   10:05 AM- Discussed all results with pt. Mildly increased WBCs and temp noted. Discussed risks/benefits of COVID testing, and pt agrees with test. Also discussed pending thyroid studies and need for prompt f/u with PMD and cardiology. Pt agrees with plan.     Provider Notes:   43 y.o. F with palpitations x 3 days. Pt states HR has been between 90-109 at home, and her prior normal resting HR was 70. No change to her routine, eating/drinking normally, no new medications.  Denies CP, SOB, leg swelling. I will check labs, hydrate, reassess. EKG is sinus tach.     Dr. Ander Slade is the primary emergency physician of record.       Diagnosis     Clinical Impression:   1. Palpitations    2. Leukocytosis, unspecified type    3. Suspected Covid-19 Virus Infection        Treatment Plan:   ED Disposition     ED Disposition Condition Date/Time Comment    Discharge  Wed Jan 22, 2019 10:07 AM [name] [name] discharge to home/self care.    Condition at disposition: Stable          This note was generated by the Epic EMR system/ Dragon speech recognition and may contain inherent errors or omissions not intended by the user. Grammatical errors, random word insertions, deletions and pronoun errors  are  occasional consequences of this technology due to software limitations. Not all errors are caught or corrected. If there are questions or concerns about the content of this note or information contained within the body of this dictation they should be addressed directly with the author for clarification.    _______________________________         Kari Baars, MD  01/22/19 1007

## 2019-01-23 ENCOUNTER — Telehealth (INDEPENDENT_AMBULATORY_CARE_PROVIDER_SITE_OTHER): Payer: 59 | Admitting: Family Medicine

## 2019-01-23 ENCOUNTER — Encounter (INDEPENDENT_AMBULATORY_CARE_PROVIDER_SITE_OTHER): Payer: Self-pay

## 2019-01-23 ENCOUNTER — Encounter (INDEPENDENT_AMBULATORY_CARE_PROVIDER_SITE_OTHER): Payer: Self-pay | Admitting: Family Medicine

## 2019-01-23 DIAGNOSIS — F902 Attention-deficit hyperactivity disorder, combined type: Secondary | ICD-10-CM

## 2019-01-23 DIAGNOSIS — R002 Palpitations: Secondary | ICD-10-CM

## 2019-01-23 DIAGNOSIS — J0101 Acute recurrent maxillary sinusitis: Secondary | ICD-10-CM

## 2019-01-23 LAB — COVID-19 (SARS-COV-2): SARS CoV 2 Overall Result: NOT DETECTED

## 2019-01-23 MED ORDER — AMOXICILLIN 875 MG PO TABS
875.00 mg | ORAL_TABLET | Freq: Two times a day (BID) | ORAL | 0 refills | Status: AC
Start: 2019-01-23 — End: 2019-02-02

## 2019-01-23 MED ORDER — NORTRIPTYLINE HCL 25 MG PO CAPS
25.00 mg | ORAL_CAPSULE | Freq: Every evening | ORAL | 5 refills | Status: DC
Start: 2019-01-23 — End: 2019-05-07

## 2019-01-23 NOTE — Progress Notes (Signed)
Subjective:      Patient ID: [name] [name] is a 43 y.o. female     Chief Complaint   Patient presents with    Sinusitis     Pt has some concerns about some medications.        Verbal consent has been obtained from the patient to conduct a video and telephone visit to minimize exposure to COVID-19: yes     HPI   Patient presents with follow up from ER. She had some palpitations over the past three days and a sinus infection. She has gained weight in the past few months which was unplanned. She does complain of sinus pain and headaches with green mucus from her nose. She reports this has been occurring for one week and is getting worse. She is not having any fevers but did have a elevated wbc count at the ER. She reports that her palpitations have continued and happen intermittently. She states that when she uses her albuterol it does make it a little worse.Her heart rate went up to 109. She has had no cp, sob, leg swelling, or nausea. She is using a rinse to flush out her nose.    The following portions of the patient's history were reviewed and updated as appropriate: allergies, current medications, past family history, past medical history, past social history, past surgical history and problem list.    Current Medications:  Outpatient Medications Marked as Taking for the 01/23/19 encounter (Telemedicine Visit) with Shellia Cleverly, DO   Medication Sig Dispense Refill    albuterol (PROVENTIL HFA;VENTOLIN HFA) 108 (90 Base) MCG/ACT inhaler Inhale 2 puffs into the lungs every 4 (four) hours as needed for Wheezing 1 each 3    fluticasone (FLOVENT HFA) 44 MCG/ACT inhaler Inhale 2 puffs into the lungs 2 (two) times daily 1 Inhaler 5    nortriptyline (PAMELOR) 25 MG capsule Take 1 capsule (25 mg total) by mouth nightly 30 capsule 5    [DISCONTINUED] nortriptyline (PAMELOR) 25 MG capsule Take 1 capsule (25 mg total) by mouth nightly 30 capsule 5       Allergies:  Allergies   Allergen Reactions    Dust Mite  Extract     Mold Extract [Trichophyton]     Prednisone Other (See Comments)     Mood changes  ICS- OK        ROS         There were no vitals taken for this visit.   Objective:   Physical Exam   Constitutional: He is oriented to person, place, and time and well-developed, well-nourished, and in no distress.   HENT:   Head: Atraumatic, normocephalic, patient pushed on sinuses frontal and maxillary and she reported they were tender   Eyes: Conjunctiva normal, EOMI  Card: No extremity swelling, no shortness of breath when speaking, patient did not appear to be in any pain  Resp: No audible wheezing  Neuro: Speech normal, no facial droop  Psychiatric: Affect, judgment, and thought content normal.      - Physical exam limited secondary to tele visit         Assessment and Plan:   1. Attention deficit hyperactivity disorder (ADHD), combined type  - nortriptyline (PAMELOR) 25 MG capsule; Take 1 capsule (25 mg total) by mouth nightly  Dispense: 30 capsule; Refill: 5    2. Acute recurrent maxillary sinusitis  - amoxicillin (AMOXIL) 875 MG tablet; Take 1 tablet (875 mg total) by mouth 2 (two) times daily  for 10 days  Dispense: 20 tablet; Refill: 0    3. Palpitations  - Referral to Cardiology - EXTERNAL     - Advised patient to avoid using albuterol unless she is wheezing or sob. Will refill her adhd medication as she is stable on the current dosage. Will refer to cardiology for work up and possible holter monitor. Treat sinusitis as before.    Shellia Cleverly, DO        Time spent in discussion: 8 minutes

## 2019-01-23 NOTE — Progress Notes (Signed)
Have you seen any specialists/other providers since your last visit with us?      No      Arm preference verified?     Yes    The patient is due for nothing at this time.

## 2019-01-23 NOTE — Progress Notes (Signed)
Covid-19 not detected, nothing further to do.

## 2019-01-24 ENCOUNTER — Emergency Department
Admission: EM | Admit: 2019-01-24 | Discharge: 2019-01-24 | Disposition: A | Discharge status: Home / Self Care | Payer: 59 | Attending: Emergency Medicine | Admitting: Emergency Medicine

## 2019-01-24 ENCOUNTER — Emergency Department: Payer: 59

## 2019-01-24 DIAGNOSIS — R1011 Right upper quadrant pain: Secondary | ICD-10-CM

## 2019-01-24 DIAGNOSIS — K859 Acute pancreatitis without necrosis or infection, unspecified: Secondary | ICD-10-CM | POA: Insufficient documentation

## 2019-01-24 LAB — COMPREHENSIVE METABOLIC PANEL
ALT: 56 U/L — ABNORMAL HIGH (ref 0–55)
AST (SGOT): 41 U/L — ABNORMAL HIGH (ref 5–34)
Albumin/Globulin Ratio: 1.2 (ref 0.9–2.2)
Albumin: 4.3 g/dL (ref 3.5–5.0)
Alkaline Phosphatase: 74 U/L (ref 37–106)
Anion Gap: 13 (ref 5.0–15.0)
BUN: 5 mg/dL — ABNORMAL LOW (ref 7.0–19.0)
Bilirubin, Total: 0.4 mg/dL (ref 0.2–1.2)
CO2: 25 mEq/L (ref 22–29)
Calcium: 9.1 mg/dL (ref 8.5–10.5)
Chloride: 100 mEq/L (ref 100–111)
Creatinine: 0.7 mg/dL (ref 0.6–1.0)
Globulin: 3.5 g/dL (ref 2.0–3.6)
Glucose: 112 mg/dL — ABNORMAL HIGH (ref 70–100)
Potassium: 4 mEq/L (ref 3.5–5.1)
Protein, Total: 7.8 g/dL (ref 6.0–8.3)
Sodium: 138 mEq/L (ref 136–145)

## 2019-01-24 LAB — URINALYSIS, REFLEX TO MICROSCOPIC EXAM IF INDICATED
Bilirubin, UA: NEGATIVE
Blood, UA: NEGATIVE
Glucose, UA: NEGATIVE
Leukocyte Esterase, UA: NEGATIVE
Nitrite, UA: NEGATIVE
Protein, UR: NEGATIVE
Specific Gravity UA: 1.01 (ref 1.001–1.035)
Urine pH: 6.5 (ref 5.0–8.0)
Urobilinogen, UA: 0.2 mg/dL (ref 0.2–2.0)

## 2019-01-24 LAB — URINE HCG QUALITATIVE: Urine HCG Qualitative: NEGATIVE

## 2019-01-24 LAB — CBC AND DIFFERENTIAL
Absolute NRBC: 0 10*3/uL (ref 0.00–0.00)
Basophils Absolute Automated: 0.06 10*3/uL (ref 0.00–0.08)
Basophils Automated: 0.5 %
Eosinophils Absolute Automated: 0.07 10*3/uL (ref 0.00–0.44)
Eosinophils Automated: 0.5 %
Hematocrit: 40 % (ref 34.7–43.7)
Hgb: 12.6 g/dL (ref 11.4–14.8)
Immature Granulocytes Absolute: 0.15 10*3/uL — ABNORMAL HIGH (ref 0.00–0.07)
Immature Granulocytes: 1.1 %
Lymphocytes Absolute Automated: 2.67 10*3/uL (ref 0.42–3.22)
Lymphocytes Automated: 20.4 %
MCH: 26.4 pg (ref 25.1–33.5)
MCHC: 31.5 g/dL (ref 31.5–35.8)
MCV: 83.9 fL (ref 78.0–96.0)
MPV: 10.2 fL (ref 8.9–12.5)
Monocytes Absolute Automated: 1 10*3/uL — ABNORMAL HIGH (ref 0.21–0.85)
Monocytes: 7.6 %
Neutrophils Absolute: 9.17 10*3/uL — ABNORMAL HIGH (ref 1.10–6.33)
Neutrophils: 69.9 %
Nucleated RBC: 0 /100 WBC (ref 0.0–0.0)
Platelets: 335 10*3/uL (ref 142–346)
RBC: 4.77 10*6/uL (ref 3.90–5.10)
RDW: 15 % (ref 11–15)
WBC: 13.12 10*3/uL — ABNORMAL HIGH (ref 3.10–9.50)

## 2019-01-24 LAB — GFR: EGFR: >60

## 2019-01-24 LAB — LIPASE: Lipase: 108 U/L — ABNORMAL HIGH (ref 8–78)

## 2019-01-24 MED ORDER — SODIUM CHLORIDE 0.9 % IV BOLUS
1000.00 mL | Freq: Once | INTRAVENOUS | Status: AC
Start: 2019-01-24 — End: 2019-01-24
  Administered 2019-01-24: 18:00:00 1000 mL via INTRAVENOUS

## 2019-01-24 MED ORDER — KETOROLAC TROMETHAMINE 30 MG/ML IJ SOLN
30.00 mg | Freq: Once | INTRAMUSCULAR | Status: AC
Start: 2019-01-24 — End: 2019-01-24
  Administered 2019-01-24: 18:00:00 30 mg via INTRAVENOUS
  Filled 2019-01-24: qty 1

## 2019-01-24 MED ORDER — TRAMADOL HCL 50 MG PO TABS
50.0000 mg | ORAL_TABLET | Freq: Four times a day (QID) | ORAL | 0 refills | Status: DC | PRN
Start: 2019-01-24 — End: 2019-01-25

## 2019-01-24 MED ORDER — ONDANSETRON HCL 4 MG/2ML IJ SOLN
4.00 mg | Freq: Once | INTRAMUSCULAR | Status: AC
Start: 2019-01-24 — End: 2019-01-24
  Administered 2019-01-24: 18:00:00 4 mg via INTRAVENOUS
  Filled 2019-01-24: qty 2

## 2019-01-24 NOTE — ED Provider Notes (Signed)
EMERGENCY DEPARTMENT HISTORY AND PHYSICAL EXAM    Date: 01/24/2019  Patient Name: [name] [name]  Attending Physician: Carles Collet   Mid-level: Franciso [place] PA-C    History of Presenting Illness    Physician/Midlevel provider first contact with patient: 01/24/19 1858           Personal Protective Equipment (PPE)    Gloves, Goggles, Surgical / Bouffant Cap and procedural mask       Chief Complaint   Patient presents with    Flank Pain       History Provided By: Patient     Chief Complaint: epigastric, RUQ pain radiating through to R flank area, excessive belching, floating ragged soft yellow stools  Onset: 3 days  Timing: intermittent   Location: epigastric, RUQ, radiating through to R flank  Quality: sharp, feeling of bloating  Severity: sometimes as severe as 8, now a 1  Modifying Factors: worse post-prandial     Additional History: [name] [name] is a 43 y.o. female with complaint of 3 days of intermittent severe epigastric, RUQ pain radiating through to R flank area, excessive belching, nausea without vomiting, scant floating ragged soft yellow stools. Pain is worse when eating so has not eaten very much last few days. Tolerating light diet of noodles. Pt denies fever, chills, hematuria, dysuria, urinary frequency or urgency. No hx of prior abdominal surgery. Occasionally suffers constipation.     PCP: Roe Rutherford, PA    No current facility-administered medications for this encounter.     Current Outpatient Medications:     albuterol (PROVENTIL HFA;VENTOLIN HFA) 108 (90 Base) MCG/ACT inhaler, Inhale 2 puffs into the lungs every 4 (four) hours as needed for Wheezing, Disp: 1 each, Rfl: 3    amoxicillin (AMOXIL) 875 MG tablet, Take 1 tablet (875 mg total) by mouth 2 (two) times daily for 10 days, Disp: 20 tablet, Rfl: 0    atomoxetine (STRATTERA) 80 MG capsule, Take 1 capsule (80 mg total) by mouth daily, Disp: 30 capsule, Rfl: 5    fexofenadine (ALLEGRA) 180 MG tablet, Take 180 mg by mouth  daily, Disp: , Rfl:     fluticasone (FLOVENT HFA) 44 MCG/ACT inhaler, Inhale 2 puffs into the lungs 2 (two) times daily, Disp: 1 Inhaler, Rfl: 5    nortriptyline (PAMELOR) 25 MG capsule, Take 1 capsule (25 mg total) by mouth nightly, Disp: 30 capsule, Rfl: 5    traMADol (ULTRAM) 50 MG tablet, Take 1 tablet (50 mg total) by mouth every 6 (six) hours as needed for Pain (severe) Do not drive or operate machinery while taking this medication, Disp: 12 tablet, Rfl: 0    vitamins/minerals Tab, Take 1 tablet by mouth daily., Disp: , Rfl:     The review of the patient's previously prescribed medications does not in any way constitute an endorsement by this clinician of their use, dosage, indications, route, efficacy, interactions, or other clinical parameters.  Past Medical History     Past Medical History:   Diagnosis Date    Asthma     Attention deficit hyperactivity disorder (ADHD)     Convulsions     Seasonal allergic rhinitis      Past Surgical History:   Procedure Laterality Date    THUMB, BASILAR SCOPE WITH DEBRIDEMENT AND LIGAMENT RECONSTRUCTION Left 2015       Family History     Family History   Problem Relation Age of Onset    Cancer Mother  breast cancer    Lymphoma Mother     Heart disease Father     Cancer Maternal Grandmother         breast cancer       Social History     Social History     Tobacco Use    Smoking status: Never Smoker    Smokeless tobacco: Never Used   Substance Use Topics    Alcohol use: No    Drug use: No       Allergies     Allergies   Allergen Reactions    Dust Mite Extract     Mold Extract [Trichophyton]     Prednisone Other (See Comments)     Mood changes  ICS- OK          Past Medical History, Past Surgical History, Family History, Social History, Allergies reviewed and pertinent elements documented, if relevant to the case, otherwise noncontributory.    Review of Systems     Review of Systems   Constitutional: Negative for chills, fever and weight loss.    HENT: Negative.    Respiratory: Negative.  Negative for cough and shortness of breath.    Cardiovascular: Negative.  Negative for chest pain.   Gastrointestinal: Positive for abdominal pain (epigastric radiating through to R back) and nausea. Negative for constipation, diarrhea and vomiting.        Soft, ragged yellow tan floating stools.    Genitourinary: Negative.  Negative for dysuria, flank pain, frequency, hematuria and urgency.   Musculoskeletal: Negative.  Negative for myalgias.   Skin: Negative.    Endo/Heme/Allergies: Negative for polydipsia.       Physical Exam   BP 170/74    Pulse 86    Temp 99.1 F (37.3 C) (Oral)    Resp 16    Ht 5\' 3"  (1.6 m)    Wt 145.2 kg    LMP 12/25/2018    SpO2 97%    BMI 56.69 kg/m     Physical Exam   Constitutional: She is oriented to person, place, and time and well-developed, well-nourished, and in no distress.   obese   HENT:   Head: Normocephalic and atraumatic.   Right Ear: External ear normal.   Left Ear: External ear normal.   Nose: Nose normal.   Mouth/Throat: Oropharynx is clear and moist.   Eyes: Pupils are equal, round, and reactive to light. Conjunctivae are normal.   Neck: Normal range of motion. Neck supple.   Cardiovascular: Normal rate, regular rhythm, normal heart sounds and intact distal pulses.   Pulmonary/Chest: Effort normal and breath sounds normal.   Abdominal: Soft. Bowel sounds are normal. She exhibits no distension and no abdominal bruit. There is abdominal tenderness. There is no rebound, no guarding, no CVA tenderness, no tenderness at McBurney's point and negative Murphy's sign.   Exam limited by body habitus   Lymphadenopathy:     She has no cervical adenopathy.   Neurological: She is alert and oriented to person, place, and time.   Skin: Skin is warm and dry.   Psychiatric: Mood, memory, affect and judgment normal.   Nursing note and vitals reviewed.      Diagnostic Study Results     Labs -     Results     Procedure Component Value Units  Date/Time    GFR [[id number]] Collected:  01/24/19 1947     Updated:  01/24/19 2019     EGFR >60.0    Comprehensive metabolic  panel [[id number]]  (Abnormal) Collected:  01/24/19 1947    Specimen:  Blood Updated:  01/24/19 2019     Glucose 112 mg/dL      BUN 5.0 mg/dL      Creatinine 0.7 mg/dL      Sodium 409 mEq/L      Potassium 4.0 mEq/L      Chloride 100 mEq/L      CO2 25 mEq/L      Calcium 9.1 mg/dL      Protein, Total 7.8 g/dL      Albumin 4.3 g/dL      AST (SGOT) 41 U/L      ALT 56 U/L      Alkaline Phosphatase 74 U/L      Bilirubin, Total 0.4 mg/dL      Globulin 3.5 g/dL      Albumin/Globulin Ratio 1.2     Anion Gap 13.0    Lipase [[id number]]  (Abnormal) Collected:  01/24/19 1947    Specimen:  Blood Updated:  01/24/19 2019     Lipase 108 U/L     CBC and differential [[id number]]  (Abnormal) Collected:  01/24/19 1947    Specimen:  Blood Updated:  01/24/19 1959     WBC 13.12 x10 3/uL      Hgb 12.6 g/dL      Hematocrit 57.8 %      Platelets 335 x10 3/uL      RBC 4.77 x10 6/uL      MCV 83.9 fL      MCH 26.4 pg      MCHC 31.5 g/dL      RDW 15 %      MPV 10.2 fL      Neutrophils 69.9 %      Lymphocytes Automated 20.4 %      Monocytes 7.6 %      Eosinophils Automated 0.5 %      Basophils Automated 0.5 %      Immature Granulocytes 1.1 %      Nucleated RBC 0.0 /100 WBC      Neutrophils Absolute 9.17 x10 3/uL      Lymphocytes Absolute Automated 2.67 x10 3/uL      Monocytes Absolute Automated 1.00 x10 3/uL      Eosinophils Absolute Automated 0.07 x10 3/uL      Basophils Absolute Automated 0.06 x10 3/uL      Immature Granulocytes Absolute 0.15 x10 3/uL      Absolute NRBC 0.00 x10 3/uL     Urine HCG, Qualitative [[id number]] Collected:  01/24/19 1802    Specimen:  Urine Updated:  01/24/19 1817     Urine HCG Qualitative Negative    UA with reflex to micro (pts  3 + yrs) [[id number]]  (Abnormal) Collected:  01/24/19 1802    Specimen:  Urine Updated:  01/24/19 1813     Urine Type Clean Catch     Color, UA Yellow     Clarity, UA  Clear     Specific Gravity UA 1.010     Urine pH 6.5     Leukocyte Esterase, UA Negative     Nitrite, UA Negative     Protein, UR Negative     Glucose, UA Negative     Ketones UA Trace     Urobilinogen, UA 0.2 mg/dL      Bilirubin, UA Negative     Blood, UA Negative          Radiologic Studies -   Radiology Results (  24 Hour)     Procedure Component Value Units Date/Time    US Abdomen Complete [[id number]] Collected:  01/24/19 2118    Order Status:  Completed Updated:  01/24/19 2125    Narrative:       Abdominal ultrasound       Indication: abdominal pain right upper quadrant    Comparison: None    Findings:   Decreased acoustic penetration of the liver. Liver 25 cm in length         No gallbladder wall thickening or cholelithiasis.    Common duct measures   4  mm in maximum diameter with no evidence of  shadowing stone.    The spleen is normal in size, contour, and echotexture and measures    9.7  cm in length.       The head of the pancreas is unremarkable. The body and tail is obscured  by overlying bowel gas.    Obscured and IVC aorta.    The bilateral kidneys are normal in size, contour, and echotexture with  the right kidney measuring 12.7 cm and the left kidney measuring 11.7  cm.    Normal cortical thickness bilaterally.      Impression:         Marked hepatomegaly. No gallstones.           Lorinda Creed, MD   01/24/2019 9:20 PM      .    Clinical Course in the Emergency Department     Consultations:      Re-evaluations:  20:25  Pain resolved. Tolerating po fluids.     Procedures:      Medical Decision Making   I am the first provider for this patient.    I reviewed the vital signs, available nursing notes, past medical history, past surgical history, family history and social history.    Vital Signs-Reviewed the patient's vital signs.     Patient Vitals for the past 12 hrs:   BP Temp Pulse Resp   01/24/19 2142 170/74  86 16   01/24/19 2020 170/81  97 18   01/24/19 1835 179/80 99.1 F (37.3 C) 99 16   01/24/19  1740 (!) 196/91 99 F (37.2 C) (!) 101 20       Pulse Oximetry Interpretation: Normal SpO2: 98 % on RA    Provider's Notes:  Case d/w Dr Conley Rolls.     Patient counseled on diagnosis, treatment plan, f/u plans, and signs and symptoms when to return to ED. Pt is stable and ready for discharge.       Diagnosis and Treatment Plan       Clinical Impression:   1. Acute pancreatitis, unspecified complication status, unspecified pancreatitis type    2. RUQ abdominal pain             Ree Edman, [place]  01/24/19 2332       Annett Fabian, MD  01/27/19 2151

## 2019-01-24 NOTE — ED Triage Notes (Signed)
right flank pain starting today at 1400 with nausea. no vomiting. when she eats, she burps

## 2019-01-24 NOTE — Discharge Instructions (Signed)
Drink lots of fluids.     Pancreatitis    You have been diagnosed with pancreatitis.    The pancreas is an organ near the stomach that secretes (releases) insulin and enzymes. These help your stomach digest food. Pancreatitis is an inflammation of the pancreas. It can cause severe pain in the stomach and back. It can also cause nausea (sick to the stomach) and vomiting (throwing up).    Pancreatitis may be caused by a gallstone blocking the digestive enzymes trying to leave the pancreas. Pancreatitis may also be caused by alcohol use. It can also be caused by trauma (injury) or a high level of blood triglycerides (fats).    Pancreatitis may be treated with pain or nausea medicine. Take all medicines as prescribed.    Drink only clear liquids like water, clear caffeine free soft drinks, or sports drinks for the next   24 hours.    YOU SHOULD SEEK MEDICAL ATTENTION IMMEDIATELY, EITHER HERE OR AT THE NEAREST EMERGENCY DEPARTMENT, IF ANY OF THE FOLLOWING OCCURS:   The pain suddenly gets worse.   You keep vomiting a lot or vomit dark green material.   Vomiting blood or material that looks like "coffee grounds." You see blood in your stool (poop). Your stool gets very dark or black and looks like tar.   Fever (temperature higher than 100.39F / 38C), or shaking chills develop.     RUQ Abdominal Pain, NOS    You have been seen for pain in the right upper part of the abdomen (belly). This is called the "right upper quadrant."    The pain may be biliary colic. Biliary colic means "cramping of the gallbladder. Biliary colic is pain from a gallbladder that has gallstones.    The gallbladder is a small sack that hangs from the liver. It stores liquid called bile. The liver makes bile. When you eat, the gall bladder squeezes bile through a duct (tube) into the intestines. This helps to digest food.    Gallstones form from bile crystals. The stone may be smaller than a pea or as large as a golf ball. There may be  one or several stones. The stone may block the tube that drains the bile from the gallbladder. This blockage causes gallbladder spasms and pain. The pain usually comes and goes and is crampy. It often starts after eating foods with a lot of fat.    You should eat fewer fatty foods.    Your pain and any other symptoms, such as nausea (sick to your stomach), can be treated with medicines.    You should drink only clear liquids like water, clear caffeine-free soft drinks or sports drinks for the next:   24 hours.    YOU SHOULD SEEK MEDICAL ATTENTION IMMEDIATELY, EITHER HERE OR AT THE NEAREST EMERGENCY DEPARTMENT, IF ANY OF THE FOLLOWING OCCURS:   Pain gets worse or does not go away.   You keep vomiting or cant keep any fluids down.   You have fever (temperature higher than 100.39F / 38C) or shaking chills.   Yellowing of your skin or eyes.   Dark, brown-colored urine.     Low Fat Diet     Your gallbladder is located just under your liver. It is a small sac that holds bile. Bile is made by the liver but stored in the gallbladder. After eating a meal, the gallbladder squeezes the bile into the intestines. Bile helps digest fat. Your surgeon decided that you needed to have  your gallbladder taken out. After the gallbladder is taken out, you still make bile from your liver. However, it is not stored anywhere. The bile made from the liver will now leak slowly into your intestines all day.    Please follow a low-fat diet.     A low-fat diet is exactly what it sounds like. Some tips to start eating a low-fat diet:   Look at food labels and see how much fat is in the food. Ideally the fat content should be less than 25%. You can buy low-fat versions of most foods at the grocery store.      Stay away from fried food! These foods are always high in fat. Instead, grill these food items. This lowers the amount of fat.      Red meats often have high-fat contents. Red meats include beef, pork, and lamb. Substitute  lean meats for a low-fat diet. Lean meats include [place], chicken, and fish.     Milk often has a lot of fat in it as well. Switch from whole milk to 1% milk or skim milk. This applies to other dairy products as well. You can buy low-fat yogurt, cream cheese, sour cream, and cheeses.     Fruits and vegetables are always low in fat. Some fruits and veggies do contain fat, but it is the "good" kind of fat. Make sure you get 4-6 servings of fruits and vegetables each day.    In addition to a low-fat diet, you should also consume more fiber. This helps keep bowel movements regular and prevent constipation. It will also help keep you full longer and reduce cravings for fatty food. It will also help lower the risk of colon cancer in the future.     Also, make sure you get enough water at home. Drinking 8 glasses of water a day will help keep you full for longer. It also reduces cravings for high-fat food. Plus, it keeps you from getting dehydrated. Avoid sugary drinks.    If you still have trouble managing a low-fat diet at home, tell your surgeon during your follow-up visits. They may be able to recommend a dietician or a nutrition specialist to help you.

## 2019-01-25 MED ORDER — OXYCODONE HCL 5 MG PO TABS
5.00 mg | ORAL_TABLET | Freq: Four times a day (QID) | ORAL | 0 refills | Status: AC | PRN
Start: 2019-01-25 — End: 2019-02-01

## 2019-01-28 ENCOUNTER — Telehealth (INDEPENDENT_AMBULATORY_CARE_PROVIDER_SITE_OTHER): Payer: Self-pay | Admitting: Physician Assistant

## 2019-01-28 ENCOUNTER — Telehealth (INDEPENDENT_AMBULATORY_CARE_PROVIDER_SITE_OTHER): Payer: 59 | Admitting: Cardiovascular Disease

## 2019-01-28 NOTE — Telephone Encounter (Signed)
appt has been scheduled Date:01/29/19   Pt declined video visit       Meth, Garlan Fair, PA sent to [facility] Desk            Please call patient to schedule a video visit with me this week       Thank you!   Leotis Shames

## 2019-01-29 ENCOUNTER — Ambulatory Visit (INDEPENDENT_AMBULATORY_CARE_PROVIDER_SITE_OTHER): Payer: 59 | Admitting: Physician Assistant

## 2019-01-29 ENCOUNTER — Encounter (INDEPENDENT_AMBULATORY_CARE_PROVIDER_SITE_OTHER): Payer: Self-pay | Admitting: Physician Assistant

## 2019-01-29 VITALS — BP 147/85 | HR 92 | Temp 98.6°F | Ht 63.0 in | Wt 324.0 lb

## 2019-01-29 DIAGNOSIS — I10 Essential (primary) hypertension: Secondary | ICD-10-CM

## 2019-01-29 DIAGNOSIS — K859 Acute pancreatitis without necrosis or infection, unspecified: Secondary | ICD-10-CM | POA: Insufficient documentation

## 2019-01-29 LAB — COMPREHENSIVE METABOLIC PANEL
ALT: 65 U/L — ABNORMAL HIGH (ref 0–55)
AST (SGOT): 32 U/L (ref 5–34)
Albumin/Globulin Ratio: 1.1 (ref 0.9–2.2)
Albumin: 4 g/dL (ref 3.5–5.0)
Alkaline Phosphatase: 73 U/L (ref 37–106)
Anion Gap: 11 (ref 5.0–15.0)
BUN: 8 mg/dL (ref 7.0–19.0)
Bilirubin, Total: 0.5 mg/dL (ref 0.2–1.2)
CO2: 26 mEq/L (ref 21–29)
Calcium: 9 mg/dL (ref 8.5–10.5)
Chloride: 100 mEq/L (ref 100–111)
Creatinine: 0.8 mg/dL (ref 0.4–1.5)
Globulin: 3.6 g/dL (ref 2.0–3.7)
Glucose: 107 mg/dL — ABNORMAL HIGH (ref 70–100)
Potassium: 4.4 mEq/L (ref 3.5–5.1)
Protein, Total: 7.6 g/dL (ref 6.0–8.3)
Sodium: 137 mEq/L (ref 136–145)

## 2019-01-29 LAB — LIPID PANEL
Cholesterol / HDL Ratio: 4.8
Cholesterol: 177 mg/dL (ref 0–199)
HDL: 37 mg/dL — ABNORMAL LOW (ref 40–9999)
LDL Calculated: 107 mg/dL — ABNORMAL HIGH (ref 0–99)
Triglycerides: 164 mg/dL — ABNORMAL HIGH (ref 34–149)
VLDL Calculated: 33 mg/dL (ref 10–40)

## 2019-01-29 LAB — GFR: EGFR: >60

## 2019-01-29 LAB — HEMOLYSIS INDEX: Hemolysis Index: 33 — ABNORMAL HIGH (ref 0–18)

## 2019-01-29 LAB — LIPASE: Lipase: 391 U/L — ABNORMAL HIGH (ref 8–78)

## 2019-01-29 MED ORDER — LOSARTAN POTASSIUM 50 MG PO TABS
50.0000 mg | ORAL_TABLET | Freq: Every day | ORAL | 1 refills | Status: DC
Start: 2019-01-29 — End: 2019-03-12

## 2019-01-29 NOTE — Progress Notes (Signed)
Subjective:      Patient ID: [name] [name]  is a 43 y.o.  female.     Chief Complaint   Patient presents with    Follow-up     Dx: pancreatis, pt states she feels better, abd pain is much better. States if she limits her foods to liquids it helps with the pain      HPI  Pt seen at ED on 7/8 and again 7/10  Diagnosed with acute pancreatitis  ED notes reviewed    Pains has significantly improved and she is advancing diet very slowly  No prior hx pancreatitis, she does not drink ETOH  No longer taking pain Rx  Afebrile, normal BM  Pt does not recall recent lipid screen   She is fasting today  Lab Results   Component Value Date    LIP 108 (H) 01/24/2019     In addition, her BP is elevated today and has been at previous visits  Pt has home cuff and reports readings 140's / 80's  She has not been tx for HTN in the past    The following sections were reviewed this encounter by the provider:   Tobacco   Allergies   Meds   Problems   Med Hx   Surg Hx   Fam Hx         Review of Systems   Constitutional: Positive for appetite change. Negative for activity change, fatigue and fever.   HENT: Negative.    Respiratory: Negative for chest tightness and shortness of breath.    Cardiovascular: Negative for chest pain.   Gastrointestinal: Negative for abdominal pain, blood in stool, constipation, diarrhea, nausea and vomiting.   Skin: Negative for color change.         BP 147/85 (BP Site: Right arm, Patient Position: Sitting, Cuff Size: Large)    Pulse 92    Temp 98.6 F (37 C) (Oral)    Ht 1.6 m (5\' 3" )    Wt 147 kg (324 lb)    LMP 12/30/2018    BMI 57.39 kg/m     Objective:   Physical Exam  Constitutional:       General: She is not in acute distress.     Appearance: Normal appearance. She is obese. She is not ill-appearing.   HENT:      Head: Normocephalic.   Cardiovascular:      Rate and Rhythm: Normal rate and regular rhythm.   Neurological:      Mental Status: She is alert.          Assessment:   1. Acute pancreatitis,  unspecified complication status, unspecified pancreatitis type  - Lipid panel  - Lipase  - Comprehensive metabolic panel    2. Hypertension, unspecified type  - losartan (COZAAR) 50 MG tablet; Take 1 tablet (50 mg total) by mouth daily  Dispense: 90 tablet; Refill: 1         Plan:   Check labs today  Cont with bland, soft diet and increase slowly  Hypertension plan and Goals  needs improvement  Goal for blood pressure level: <140/90  Medication: begin Losartan 50mg .  Reviewed diet, exercise and weight control.  Recommended sodium restriction.  Follow up: 4 weeks and as needed.    Risk & Benefits of any new medication(s) were explained to the patient who verbalized understanding & agreed to the treatment plan.     Call if symptoms persist, worsen, or change.  Call with updates/questions/concerns.  Cristy Friedlander, MPH

## 2019-01-29 NOTE — Progress Notes (Signed)
Have you seen any specialists/other providers since your last visit with [place]?    No    Do you agree to telemedicine visit?  No    Arm preference verified?   Yes, no preference    Health Maintenance Due   Topic Date Due    MAMMOGRAM  03/17/2018    PAP SMEAR  03/17/2018

## 2019-01-30 ENCOUNTER — Other Ambulatory Visit (INDEPENDENT_AMBULATORY_CARE_PROVIDER_SITE_OTHER): Payer: Self-pay | Admitting: Physician Assistant

## 2019-01-30 DIAGNOSIS — K859 Acute pancreatitis without necrosis or infection, unspecified: Secondary | ICD-10-CM

## 2019-01-31 ENCOUNTER — Other Ambulatory Visit (INDEPENDENT_AMBULATORY_CARE_PROVIDER_SITE_OTHER): Payer: Self-pay

## 2019-02-01 MED ORDER — ALBUTEROL SULFATE HFA 108 (90 BASE) MCG/ACT IN AERS
2.0000 | INHALATION_SPRAY | RESPIRATORY_TRACT | 3 refills | Status: AC | PRN
Start: 2019-02-01 — End: ?

## 2019-02-04 ENCOUNTER — Other Ambulatory Visit (INDEPENDENT_AMBULATORY_CARE_PROVIDER_SITE_OTHER): Payer: Self-pay | Admitting: Family Medicine

## 2019-02-04 ENCOUNTER — Telehealth (INDEPENDENT_AMBULATORY_CARE_PROVIDER_SITE_OTHER): Payer: Self-pay | Admitting: Physician Assistant

## 2019-02-04 ENCOUNTER — Encounter (INDEPENDENT_AMBULATORY_CARE_PROVIDER_SITE_OTHER): Payer: Self-pay | Admitting: Family Medicine

## 2019-02-04 DIAGNOSIS — K859 Acute pancreatitis without necrosis or infection, unspecified: Secondary | ICD-10-CM

## 2019-02-04 NOTE — Telephone Encounter (Signed)
Please call patient to schedule a video visit with me    Thank you!  Leotis Shames

## 2019-02-04 NOTE — Telephone Encounter (Signed)
Video visit has been scheduled Date:02/10/19

## 2019-02-04 NOTE — Telephone Encounter (Signed)
Pt came into the office for Lab work and informed she had a reaction to losartan (COZAAR) 50 MG tablet and stopped taking it. She states after she took it the BP numbers were not accurate and where she wants them to be. Best number to reach her at is [phone]

## 2019-02-05 LAB — HEPATIC FUNCTION PANEL
ALT: 47 IU/L — ABNORMAL HIGH (ref 0–32)
AST (SGOT): 24 IU/L (ref 0–40)
Albumin: 4.4 g/dL (ref 3.8–4.8)
Alkaline Phosphatase: 68 IU/L (ref 39–117)
Bilirubin Direct: 0.09 mg/dL (ref 0.00–0.40)
Bilirubin, Total: 0.3 mg/dL (ref 0.0–1.2)
Protein, Total: 7 g/dL (ref 6.0–8.5)

## 2019-02-05 LAB — LIPASE: Lipase: 44 U/L (ref 14–72)

## 2019-02-10 ENCOUNTER — Telehealth (INDEPENDENT_AMBULATORY_CARE_PROVIDER_SITE_OTHER): Payer: 59 | Admitting: Physician Assistant

## 2019-02-10 ENCOUNTER — Encounter (INDEPENDENT_AMBULATORY_CARE_PROVIDER_SITE_OTHER): Payer: Self-pay | Admitting: Physician Assistant

## 2019-02-10 DIAGNOSIS — Z9289 Personal history of other medical treatment: Secondary | ICD-10-CM

## 2019-02-10 NOTE — Progress Notes (Signed)
Subjective:      Patient ID: [name] [name]  is a 43 y.o.  female.     Chief Complaint   Patient presents with    Hypertension      Pt states BP running high over the weekend reading of 130/70 ,discontinued losartan due Side Effects.      HPI  [facility] presents via video today to FU on her BP  She tried to talking the Losartan which caused her to feel  Very lightheaded and she notes it dropped her BP below 60. Therefore, she Gisela the mediation and is working on a low Na diet and exercise  She is monitoring BP at home and reports reading today was 131/76    The following sections were reviewed this encounter by the provider:   Tobacco   Allergies   Meds   Problems   Med Hx   Surg Hx   Fam Hx         Review of Systems   Constitutional: Negative for fatigue, fever and unexpected weight change.   HENT: Negative.    Eyes: Negative for pain and visual disturbance.   Respiratory: Negative for cough, chest tightness and shortness of breath.    Cardiovascular: Negative for chest pain, palpitations and leg swelling.   Gastrointestinal: Negative.    Skin: Negative.    Neurological: Negative for weakness and headaches.         LMP 02/07/2019 (Approximate)     Objective:   Physical Exam  Vitals signs reviewed.   Constitutional:       Appearance: She is well-developed.   HENT:      Head: Normocephalic.      Nose: Nose normal.   Eyes:      Pupils: Pupils are equal, round, and reactive to light.   Neck:      Musculoskeletal: Normal range of motion and neck supple.      Thyroid: No thyromegaly.      Vascular: No carotid bruit.   Cardiovascular:      Rate and Rhythm: Normal rate and regular rhythm.      Heart sounds: No murmur. No friction rub. No gallop.    Pulmonary:      Effort: Pulmonary effort is normal. No respiratory distress.      Breath sounds: Normal breath sounds.   Abdominal:      Palpations: Abdomen is soft.      Tenderness: There is no abdominal tenderness.   Lymphadenopathy:      Cervical: No cervical  adenopathy.   Skin:     General: Skin is warm and dry.   Neurological:      Mental Status: She is alert and oriented to person, place, and time.          Assessment:   1. History of ambulatory BP monitoring         Plan:   Cont to monitor at home  Low Na diet reviewed in detail  FU 4 weeks  With BP log    Each time you measure, take two or three readings to make sure your results are accurate. Your doctor might recommend taking your blood pressure at the same times each day.    Don't measure your blood pressure right after you wake up. You can prepare for the day, but don't eat breakfast or take medications before measuring your blood pressure. If you exercise after waking, take your blood pressure before exercising.    Avoid food,  caffeine, tobacco and alcohol for 30 minutes before taking a measurement. Also, go to the toilet first. A full bladder can increase blood pressure slightly.    Sit quietly before and during monitoring. When you're ready to take your blood pressure, sit for five minutes in a comfortable position with your legs and ankles uncrossed and your back supported against a chair. Try to be calm and not think about stressful things. Don't talk while taking your blood pressure.    Make sure your arm is positioned properly. Always use the same arm when taking your blood pressure. Rest your arm, raised to the level of your heart, on a table, desk or chair arm. You might need to place a pillow or cushion under your arm to elevate it high enough.  Place the cuff on bare skin, not over clothing. Rolling up a sleeve until it tightens around your arm can result in an inaccurate reading, so you may need to slip your arm out of the sleeve.  Take a repeat reading. Wait for one to three minutes after the first reading, and then take another to check accuracy. If your monitor doesn't automatically log blood pressure readings or heart rates, write them down.    Risk & Benefits of any new medication(s) were  explained to the patient who verbalized understanding & agreed to the treatment plan.     Call if symptoms persist, worsen, or change.  Call with updates/questions/concerns.    Beatris Si, MPH

## 2019-02-10 NOTE — Progress Notes (Signed)
Have you seen any specialists/other providers since your last visit with us?    No    Arm preference verified?   Yes    The patient is due for mammogram and pap smear

## 2019-02-22 ENCOUNTER — Encounter (INDEPENDENT_AMBULATORY_CARE_PROVIDER_SITE_OTHER): Payer: Self-pay

## 2019-02-26 ENCOUNTER — Ambulatory Visit (INDEPENDENT_AMBULATORY_CARE_PROVIDER_SITE_OTHER): Payer: 59 | Admitting: Physician Assistant

## 2019-03-12 ENCOUNTER — Encounter (INDEPENDENT_AMBULATORY_CARE_PROVIDER_SITE_OTHER): Payer: Self-pay | Admitting: Physician Assistant

## 2019-03-12 ENCOUNTER — Ambulatory Visit (INDEPENDENT_AMBULATORY_CARE_PROVIDER_SITE_OTHER): Payer: 59 | Admitting: Physician Assistant

## 2019-03-12 VITALS — BP 116/67 | HR 74 | Temp 98.6°F | Ht 63.0 in | Wt 326.2 lb

## 2019-03-12 DIAGNOSIS — Z0000 Encounter for general adult medical examination without abnormal findings: Secondary | ICD-10-CM

## 2019-03-12 DIAGNOSIS — Z23 Encounter for immunization: Secondary | ICD-10-CM

## 2019-03-12 DIAGNOSIS — Z1239 Encounter for other screening for malignant neoplasm of breast: Secondary | ICD-10-CM

## 2019-03-12 DIAGNOSIS — Z124 Encounter for screening for malignant neoplasm of cervix: Secondary | ICD-10-CM

## 2019-03-12 NOTE — Progress Notes (Signed)
Patient Presents for an annual well visit.  Overall feeling well    Past Medical History:   Diagnosis Date    Asthma     Attention deficit hyperactivity disorder (ADHD)     Convulsions     Seasonal allergic rhinitis        Current Outpatient Medications   Medication Sig Dispense Refill    albuterol (PROVENTIL HFA) 108 (90 Base) MCG/ACT inhaler Inhale 2 puffs into the lungs every 4 (four) hours as needed for Wheezing 1 each 3    fexofenadine (ALLEGRA) 180 MG tablet Take 180 mg by mouth daily      fluticasone (FLOVENT HFA) 44 MCG/ACT inhaler Inhale 2 puffs into the lungs 2 (two) times daily 1 Inhaler 5    atomoxetine (STRATTERA) 80 MG capsule Take 1 capsule (80 mg total) by mouth daily 30 capsule 5    nortriptyline (PAMELOR) 25 MG capsule Take 1 capsule (25 mg total) by mouth nightly 30 capsule 5     No current facility-administered medications for this visit.        Health Maintenance   Topic Date Due    [facility] CARE PLAN LETTER  Nov 06, 1974    MAMMOGRAM  03/17/2018    INFLUENZA VACCINE  02/15/2019    DEPRESSION SCREENING  01/29/2020    PAP SMEAR  03/11/2022       Social History     Tobacco Use    Smoking status: Never Smoker    Smokeless tobacco: Never Used   Substance Use Topics    Alcohol use: No    Drug use: No     Diet - improved, mostly plant based  Exercises -  Walks, swims  Dental UTD  Vision exam UTD  Vaccines - Tdap today  Mammo- due  Pap- today  Patient's last menstrual period was 03/10/2019 (exact date).      The following sections were reviewed this encounter by the provider:   Tobacco   Allergies   Meds   Problems   Med Hx   Surg Hx   Fam Hx         Review of Systems   Constitutional: Negative for activity change, fatigue, fever and unexpected weight change.   HENT: Negative for congestion, hearing loss, rhinorrhea and sore throat.    Eyes: Negative for discharge and visual disturbance.   Respiratory: Negative for cough and shortness of breath.    Cardiovascular: Negative for chest pain,  palpitations and leg swelling.   Gastrointestinal: Negative for abdominal pain, blood in stool, constipation, diarrhea, nausea and vomiting.   Genitourinary: Negative for difficulty urinating, dyspareunia, dysuria, frequency, menstrual problem, pelvic pain, urgency, vaginal discharge and vaginal pain.   Musculoskeletal: Negative.  Negative for arthralgias, back pain, gait problem and neck pain.   Skin: Negative for rash.   Neurological: Negative for dizziness, weakness, numbness and headaches.   Psychiatric/Behavioral: Negative for behavioral problems, dysphoric mood and sleep disturbance. The patient is not nervous/anxious.    All other systems reviewed and are negative.          BP 116/67 (BP Site: Right arm, Patient Position: Sitting, Cuff Size: Large)    Pulse 74    Temp 98.6 F (37 C) (Oral)    Ht 1.6 m (5\' 3" )    Wt 148 kg (326 lb 3.2 oz)    LMP 03/10/2019 (Exact Date)    BMI 57.78 kg/m     Objective:   Physical Exam  Vitals signs reviewed.   Constitutional:  Appearance: She is well-developed. She is obese.   HENT:      Head: Normocephalic.      Right Ear: Tympanic membrane normal.      Left Ear: Tympanic membrane normal.      Nose: Nose normal.   Eyes:      Conjunctiva/sclera: Conjunctivae normal.      Pupils: Pupils are equal, round, and reactive to light.   Neck:      Musculoskeletal: Normal range of motion and neck supple.      Thyroid: No thyromegaly.   Cardiovascular:      Rate and Rhythm: Normal rate and regular rhythm.      Heart sounds: Normal heart sounds. No murmur.   Pulmonary:      Effort: Pulmonary effort is normal. No respiratory distress.      Breath sounds: Normal breath sounds.   Chest:      Breasts: Breasts are symmetrical.         Right: No mass, nipple discharge or tenderness.         Left: No mass, nipple discharge or tenderness.   Abdominal:      General: Bowel sounds are normal. There is no distension.      Palpations: There is no mass.      Tenderness: There is no abdominal  tenderness. There is no guarding.   Genitourinary:     Vagina: Normal. No vaginal discharge.      Cervix: No cervical motion tenderness, discharge or friability.      Adnexa:         Right: No mass.          Left: No mass.        Comments: Menstrual blood noted  Musculoskeletal: Normal range of motion.   Lymphadenopathy:      Cervical: No cervical adenopathy.   Skin:     General: Skin is warm and dry.   Neurological:      Mental Status: She is alert and oriented to person, place, and time.          Assessment:   1. Well adult exam    2. Breast cancer screening  - Mammo Digital Screening Bilateral W Cad    3. Cervical cancer screening  - Pap Smear, Thin Prep Method w/ HR HPV    4. Need for vaccination  - Tdap vaccine greater than or equal to 7yo IM         Plan:       --Nutrition: Stressed importance of moderation in sodium/caffeine intake, saturated fat and cholesterol, caloric balance, sufficient intake of fresh fruits, vegetables, fiber, calcium, iron   --Exercise: Stressed the importance of regular exercise, 30 minutes daily 5 times per week.   --Body mass index is 57.78 kg/m.  --Discussed the patient's BMI with her.  The BMI is above average; BMI management plan is completed.    Nutrition and Physical Activity Counseling:  Nutrition Counseling:   Food education, guidance and counseling  Physical Activity Counseling   Patient advised about exercise  HIgh BMI Follow Up  Referral to Weight Management Program  Encouragement to Exercise  10 lb wt loss goal over next 3 months      --Dental health: Discussed importance of regular tooth brushing, flossing, and dental visits.   --Immunizations reviewed.   --Pap results in approx 1 week  --Mammo requested and emphasized importance  --Labs as requested    Results may be affected by current menstrual flow  Risk &  Benefits of any new medication(s) were explained to the patient who verbalized understanding & agreed to the treatment plan.       Beatris SiLauren Hannah Strader PA-C, MPH

## 2019-03-12 NOTE — Progress Notes (Signed)
Have you seen any specialists/other providers since your last visit with [place]?    No    Arm preference verified?   Yes    The patient is due for mammogram, pap smear and [facility] letter and flu vaccine.

## 2019-03-19 LAB — PAP SMEAR, THIN PREP WITH HR HPV: HPV DNA, high risk: NOT DETECTED

## 2019-03-19 NOTE — Progress Notes (Signed)
Results communicated to patient via Mychart

## 2019-04-07 ENCOUNTER — Emergency Department
Admission: EM | Admit: 2019-04-07 | Discharge: 2019-04-07 | Disposition: A | Discharge status: Home / Self Care | Payer: 59 | Attending: Emergency Medicine | Admitting: Emergency Medicine

## 2019-04-07 ENCOUNTER — Emergency Department: Payer: 59

## 2019-04-07 DIAGNOSIS — R0602 Shortness of breath: Secondary | ICD-10-CM

## 2019-04-07 DIAGNOSIS — R079 Chest pain, unspecified: Secondary | ICD-10-CM

## 2019-04-07 DIAGNOSIS — R059 Cough, unspecified: Secondary | ICD-10-CM

## 2019-04-07 DIAGNOSIS — R0789 Other chest pain: Secondary | ICD-10-CM | POA: Insufficient documentation

## 2019-04-07 DIAGNOSIS — R091 Pleurisy: Secondary | ICD-10-CM

## 2019-04-07 DIAGNOSIS — J45909 Unspecified asthma, uncomplicated: Secondary | ICD-10-CM | POA: Insufficient documentation

## 2019-04-07 DIAGNOSIS — R05 Cough: Secondary | ICD-10-CM | POA: Insufficient documentation

## 2019-04-07 MED ORDER — CELECOXIB 100 MG PO CAPS
100.00 mg | ORAL_CAPSULE | Freq: Two times a day (BID) | ORAL | 0 refills | Status: AC
Start: 2019-04-07 — End: 2019-04-21

## 2019-04-07 MED ORDER — NEBULIZER MISC
0 refills | Status: AC
Start: 2019-04-07 — End: ?

## 2019-04-07 MED ORDER — LEVOFLOXACIN 750 MG PO TABS
750.00 mg | ORAL_TABLET | Freq: Once | ORAL | Status: AC
Start: 2019-04-07 — End: 2019-04-07
  Administered 2019-04-07: 08:00:00 750 mg via ORAL
  Filled 2019-04-07: qty 1

## 2019-04-07 MED ORDER — LEVOFLOXACIN 750 MG PO TABS
750.00 mg | ORAL_TABLET | Freq: Every day | ORAL | 0 refills | Status: AC
Start: 2019-04-07 — End: 2019-04-14

## 2019-04-07 MED ORDER — ALBUTEROL SULFATE (2.5 MG/3ML) 0.083% IN NEBU
2.50 mg | INHALATION_SOLUTION | RESPIRATORY_TRACT | 0 refills | Status: DC | PRN
Start: 2019-04-07 — End: 2019-04-15

## 2019-04-07 NOTE — ED Provider Notes (Addendum)
History     Chief Complaint   Patient presents with    Chest Pain    Shortness of Breath     HPI    43 year old female complaining of acute chest pain and shortness of breath progressing over the past several days.  Patient reports positive left ear pain and sinus congestion with increasing shortness of breath x3 days.  No nausea or vomiting.  Positive pleuritic chest pain.  No lower extremity swelling or pain.  No sick contacts.  No recent travel.  No history of PEs or DVTs.    No fever or chills.  No known COVID-19 contacts.    Additional History: [name] [name] is a 43 y.o. female     PCP:  Meth, Garlan Fair, PA    Past Medical History:   Diagnosis Date    Asthma     Attention deficit hyperactivity disorder (ADHD)     Convulsions     Seasonal allergic rhinitis        Past Surgical History:   Procedure Laterality Date    THUMB, BASILAR SCOPE WITH DEBRIDEMENT AND LIGAMENT RECONSTRUCTION Left 2015       Family History   Problem Relation Age of Onset    Cancer Mother         breast cancer    Lymphoma Mother     Heart disease Father     Cancer Maternal Grandmother         breast cancer       Social History     Tobacco Use    Smoking status: Never Smoker    Smokeless tobacco: Never Used   Substance Use Topics    Alcohol use: No    Drug use: No       Review of Systems   Constitutional: Negative.    HENT: Negative.    Eyes: Negative.    Respiratory: Positive for cough (Productive of yellow sputum) and shortness of breath.    Cardiovascular: Positive for chest pain.        Pleuritic chest pain, pain is sharp and only when coughing.   Musculoskeletal: Negative.    Neurological: Negative.    Psychiatric/Behavioral: Negative.    All other systems reviewed and are negative.      Physical Exam   BP (!) 163/92    Pulse 68    Temp 98.1 F (36.7 C) (Oral)    Resp 22    Ht 5\' 3"  (1.6 m)    Wt 142 kg    LMP 03/10/2019 (Exact Date)    SpO2 95%    BMI 55.45 kg/m     Physical Exam  Vitals signs and nursing  note reviewed.   Constitutional:       General: She is not in acute distress.     Appearance: She is well-developed.   HENT:      Head: Normocephalic and atraumatic.   Eyes:      Conjunctiva/sclera: Conjunctivae normal.   Neck:      Vascular: No JVD.   Cardiovascular:      Rate and Rhythm: Normal rate and regular rhythm.   Pulmonary:      Effort: Pulmonary effort is normal.      Breath sounds: Normal breath sounds.   Abdominal:      Palpations: Abdomen is soft.      Tenderness: There is no abdominal tenderness.   Musculoskeletal: Normal range of motion.   Skin:     General: Skin is  warm and dry.   Neurological:      Mental Status: She is alert and oriented to person, place, and time.          Assessment/Plan     MDM    EKG  Normal sinus rhythm at 83 bpm with normal intervals normal axis, normal EKG.  Cardiac monitor  Normal sinus rhythm at 80 beats minute  Medications   The patient's home medications have been reviewed.    Nursing Notes: Reviewed available nursing notes.     Medical/Surgical History:   Past Medical History:   Diagnosis Date    Asthma     Attention deficit hyperactivity disorder (ADHD)     Convulsions     Seasonal allergic rhinitis        Past Surgical History:   Procedure Laterality Date    THUMB, BASILAR SCOPE WITH DEBRIDEMENT AND LIGAMENT RECONSTRUCTION Left 2015       Social History:   Social History     Tobacco Use    Smoking status: Never Smoker    Smokeless tobacco: Never Used   Substance Use Topics    Alcohol use: No    Drug use: No        Family History:       Medications   The patient's home medications have been reviewed.     Allergies:   Dust mite extract, Mold extract [trichophyton], Prednisone, Grass extracts [gramineae pollens], Losartan, and Other     Results:  Radiology Results (24 Hour)     Procedure Component Value Units Date/Time    Chest 2 Views [[id number]] Collected: 04/07/19 0845    Order Status: Completed Updated: 04/07/19 0847    Narrative:      INDICATION: Dyspnea.  Cough.    TECHNIQUE: 2 views.    FINDINGS: No acute pulmonary infiltrates are demonstrated.  The  cardiomediastinal silhouette is within normal limits.  The costophrenic  angles are clear.      Impression:       No acute pulmonary infiltrates are demonstrated.     Merri Ray, MD   04/07/2019 8:45 AM        Results     ** No results found for the last 24 hours. **          Clinical Impression     ED Disposition     ED Disposition Condition Date/Time Comment    Discharge  Mon Apr 07, 2019  9:09 AM [name] discharge to home/self care.    Condition at disposition: Stable        1. Atypical chest pain    2. Shortness of breath    3. Cough    4. Pleurisy                 Andris Baumann, MD  04/07/19 7846       Andris Baumann, MD  04/07/19 [phone]

## 2019-04-07 NOTE — ED Triage Notes (Addendum)
[name] [name] is a 43 y.o. female c/o SOB, chest pain rt lower rib hx of pleurisy and coughing out phlegm yellow hx of Asthma sx started 3 weeks. Denies fever/sick contact with COVID. Used inhaler with slight relief

## 2019-04-07 NOTE — Discharge Instructions (Signed)
Cough    You have been seen for your cough.    There are many possible causes of cough. Most are not dangerous. Your doctor has determined that it is OK for you to go home today.    Your doctor believes your cough was caused by bacteria. Your doctor prescribed an antibiotic that will fight the bacteria.    The doctor may have prescribed some medicine to help with your cough. Use the medicine as directed.    YOU SHOULD SEEK MEDICAL ATTENTION IMMEDIATELY, EITHER HERE OR AT THE NEAREST EMERGENCY DEPARTMENT, IF ANY OF THE FOLLOWING OCCURS:   You wheeze or have trouble breathing.   You cough up mucous or lose weight for no reason.   You have a fever (temperature higher than 100.4F / 38C) that lasts more than 5 days.   You have chest pain.   Your symptoms get worse or do not get better in 2 or 3 days.   You have any new problems or concerns.

## 2019-04-08 LAB — ECG 12-LEAD
Atrial Rate: 83 {beats}/min
P Axis: 31 degrees
P-R Interval: 130 ms
Q-T Interval: 364 ms
QRS Duration: 94 ms
QTC Calculation (Bezet): 427 ms
R Axis: 8 degrees
T Axis: 26 degrees
Ventricular Rate: 83 {beats}/min

## 2019-04-10 ENCOUNTER — Telehealth (INDEPENDENT_AMBULATORY_CARE_PROVIDER_SITE_OTHER): Payer: 59 | Admitting: Physician Assistant

## 2019-04-10 ENCOUNTER — Ambulatory Visit (FREE_STANDING_LABORATORY_FACILITY): Payer: 59 | Admitting: Medical

## 2019-04-10 ENCOUNTER — Encounter (INDEPENDENT_AMBULATORY_CARE_PROVIDER_SITE_OTHER): Payer: Self-pay | Admitting: Physician Assistant

## 2019-04-10 VITALS — BP 144/84 | HR 79 | Temp 97.8°F | Resp 16 | Ht 63.0 in | Wt 306.0 lb

## 2019-04-10 DIAGNOSIS — R6889 Other general symptoms and signs: Secondary | ICD-10-CM

## 2019-04-10 DIAGNOSIS — Z20822 Contact with and (suspected) exposure to covid-19: Secondary | ICD-10-CM

## 2019-04-10 DIAGNOSIS — J029 Acute pharyngitis, unspecified: Secondary | ICD-10-CM

## 2019-04-10 DIAGNOSIS — Z03818 Encounter for observation for suspected exposure to other biological agents ruled out: Secondary | ICD-10-CM

## 2019-04-10 LAB — POCT RAPID STREP A: Rapid Strep A Screen POCT: NEGATIVE

## 2019-04-10 MED ORDER — LIDOCAINE VISCOUS HCL 2 % MT SOLN
10.00 mL | Freq: Once | OROMUCOSAL | Status: AC
Start: 2019-04-10 — End: 2019-04-10
  Administered 2019-04-10: 15:00:00 10 mL via OROMUCOSAL

## 2019-04-10 MED ORDER — ALUM & MAG HYDROXIDE-SIMETH 200-200-20 MG/5ML PO SUSP
10.00 mL | Freq: Once | ORAL | Status: AC
Start: 2019-04-10 — End: 2019-04-10
  Administered 2019-04-10: 15:00:00 10 mL via ORAL

## 2019-04-10 MED ORDER — MAGIC MOUTHWASH ORAL SUSPENSION
ORAL | 0 refills | Status: AC
Start: 2019-04-10 — End: 2019-04-24

## 2019-04-10 MED ORDER — AMOXICILLIN-POT CLAVULANATE 875-125 MG PO TABS
1.00 | ORAL_TABLET | Freq: Two times a day (BID) | ORAL | 0 refills | Status: AC
Start: 2019-04-10 — End: 2019-04-20

## 2019-04-10 NOTE — Progress Notes (Signed)
[facility]  PROGRESS NOTE     Patient: [name] [name]   Date of Service: 04/10/2019   MRN: [id number]       [name] [name] is a 43 y.o. female      HISTORY     Chief Complaint   Patient presents with    Cough     Mild cough x 3 weeks.  Pt with h/o asthma. Was treating at home.  Symptoms worsened last weekend, was seen in ER on Monday.  Diagnosed with bronchitis.  Was given antibiotic and nebulizer treatments.  Pt taking medications, then developed sore throat. Throat pain progressing, now difficult to swallow.  Also with c/o nausea.  Denies fever, SOB, chest tighhtness.  Respiratory symptoms improving.          43yo female with h/o asthma and environmental allergies presents to UC with mild cough x 3 weeks.  Pt with h/o asthma. Was treating at home, however symptoms worsened last weekend. Pt was seen in ER on Monday and diagnosed with bronchitis and sinusitis. She was treated with antibiotic and nebulizer treatments.  Pt taking medications as prescribed. She has since developed a sore throat. Throat pain is progressing, it now difficult to swallow. She admits to constant dry mouth and cracked lips. Pain is increased with swallowing. She also with c/o nausea, but denies V/D.  Denies fever, SOB, chest tighhtness.  Respiratory symptoms improving.          Review of Systems   Constitutional: Positive for appetite change. Negative for fatigue and fever.   HENT: Positive for ear pain, sinus pressure, sinus pain, sore throat and trouble swallowing. Negative for voice change.    Eyes: Negative for discharge and redness.   Respiratory: Positive for cough and chest tightness. Negative for shortness of breath.    Cardiovascular: Negative for chest pain and palpitations.   Gastrointestinal: Positive for nausea. Negative for abdominal pain, diarrhea and vomiting.   Genitourinary: Negative for decreased urine volume.   Musculoskeletal: Negative for back pain and myalgias.   Skin: Negative for rash.   Neurological:  Negative for dizziness, weakness and headaches.   Hematological: Negative for adenopathy.   Psychiatric/Behavioral: Negative for sleep disturbance.   All other systems reviewed and are negative.      History:  Past Medical History:   Diagnosis Date    Asthma     Attention deficit hyperactivity disorder (ADHD)     Convulsions     Seasonal allergic rhinitis        Past Surgical History:   Procedure Laterality Date    THUMB, BASILAR SCOPE WITH DEBRIDEMENT AND LIGAMENT RECONSTRUCTION Left 2015       Family History   Problem Relation Age of Onset    Cancer Mother         breast cancer    Lymphoma Mother     Heart disease Father     Cancer Maternal Grandmother         breast cancer       Social History     Tobacco Use    Smoking status: Never Smoker    Smokeless tobacco: Never Used   Substance Use Topics    Alcohol use: No    Drug use: No       History reviewed.        Current Outpatient Medications:     celecoxib (CeleBREX) 100 MG capsule, Take 1 capsule (100 mg total) by mouth 2 (two) times daily  for 14 days, Disp: 28 capsule, Rfl: 0    fexofenadine (ALLEGRA) 180 MG tablet, Take 180 mg by mouth daily, Disp: , Rfl:     fluticasone (FLOVENT HFA) 44 MCG/ACT inhaler, Inhale 2 puffs into the lungs 2 (two) times daily, Disp: 1 Inhaler, Rfl: 5    levoFLOXacin (LEVAQUIN) 750 MG tablet, Take 1 tablet (750 mg total) by mouth daily for 7 days, Disp: 7 tablet, Rfl: 0    albuterol (PROVENTIL HFA) 108 (90 Base) MCG/ACT inhaler, Inhale 2 puffs into the lungs every 4 (four) hours as needed for Wheezing, Disp: 1 each, Rfl: 3    albuterol (PROVENTIL) (2.5 MG/3ML) 0.083% nebulizer solution, Take 3 mLs (2.5 mg total) by nebulization every 4 (four) hours as needed for Wheezing or Shortness of Breath (coughing), Disp: 25 each, Rfl: 0    aluminum & magnesium / diphenhydramine / lidocaine / nystatin (MAGIC MOUTHWASH) suspension, Use 10 ml to swish around mouth and spit out every 8 hours as needed., Disp: 120 mL, Rfl: 0     amoxicillin-clavulanate (AUGMENTIN) 875-125 MG per tablet, Take 1 tablet by mouth 2 (two) times daily for 10 days, Disp: 20 tablet, Rfl: 0    atomoxetine (STRATTERA) 80 MG capsule, Take 1 capsule (80 mg total) by mouth daily, Disp: 30 capsule, Rfl: 5    Nebulizer Misc, Dispense one nebulizer machine to include mask and tubing, Disp: 1 each, Rfl: 0    nortriptyline (PAMELOR) 25 MG capsule, Take 1 capsule (25 mg total) by mouth nightly, Disp: 30 capsule, Rfl: 5  No current facility-administered medications for this visit.     Allergies   Allergen Reactions    Dust Mite Extract     Mold Extract [Trichophyton]     Prednisone Other (See Comments)     Mood changes  ICS- OK     Grass Extracts [Gramineae Pollens]     Losartan      dizzy    Other      All steroid       Medications and Allergies reviewed.    PHYSICAL EXAM     Vitals:    04/10/19 1425   BP: 144/84   Pulse: 79   Resp: 16   Temp: 97.8 F (36.6 C)   SpO2: 97%   Weight: 138.8 kg (306 lb)   Height: 1.6 m (5\' 3" )       Physical Exam   Nursing note and vitals reviewed.  Constitutional: She is oriented to person, place, and time. Vital signs are normal. She appears well-developed and well-nourished. No distress.   HENT:   Head: Normocephalic and atraumatic.   Nose: Nose normal.   Mouth/Throat: Oropharynx is clear and moist and mucous membranes are normal. No oral lesions. No trismus in the jaw. No uvula swelling. No oropharyngeal exudate, posterior oropharyngeal edema, posterior oropharyngeal erythema or tonsillar abscesses.       Eyes: Conjunctivae and EOM are normal. Pupils are equal, round, and reactive to light. No scleral icterus.   Neck: Normal range of motion. Neck supple. Carotid bruit is not present. No thyroid mass and no thyromegaly present.   Cardiovascular: Normal rate, regular rhythm, normal heart sounds, intact distal pulses and normal pulses.   No murmur heard.  Pulmonary/Chest: Effort normal and breath sounds normal. No tachypnea. No  respiratory distress. She has no decreased breath sounds. She has no wheezes. She has no rhonchi. She has no rales.   Abdominal: Soft. Normal appearance and bowel sounds are normal. She  exhibits no distension and no mass. There is no abdominal tenderness.   Musculoskeletal: Normal range of motion.   Lymphadenopathy:     She has cervical adenopathy.        Right cervical: Superficial cervical adenopathy present.        Left cervical: Superficial cervical adenopathy present.   Neurological: She is alert and oriented to person, place, and time. She has normal strength and normal reflexes. No cranial nerve deficit or sensory deficit. She displays a negative Romberg sign. Gait normal.   Skin: Skin is warm, dry and intact. No rash noted.   Psychiatric: She has a normal mood and affect. Her speech is normal and behavior is normal.         UCC COURSE       Results     Procedure Component Value Units Date/Time    Rapid Group A Strep [[id number]]  (Normal) Collected: 04/10/19 1453    Specimen: Throat Updated: 04/10/19 1518     POCT QC Pass     Rapid Strep A Screen POCT Negative      Comment Negative Results should be confirmed by throat Cx to confirm absence of Strep A inf.        3:27 PM - throat pain and esophageal spasm improved greatly following GI cocktail    No results found.      Orders Placed This Encounter   Medications    alum & mag hydroxide-simethicone (MAALOX PLUS) 200-200-20 mg/5 mL suspension 10 mL    lidocaine viscous (XYLOCAINE) 2 % solution 10 mL    amoxicillin-clavulanate (AUGMENTIN) 875-125 MG per tablet     Sig: Take 1 tablet by mouth 2 (two) times daily for 10 days     Dispense:  20 tablet     Refill:  0    aluminum & magnesium / diphenhydramine / lidocaine / nystatin (MAGIC MOUTHWASH) suspension     Sig: Use 10 ml to swish around mouth and spit out every 8 hours as needed.     Dispense:  120 mL     Refill:  0     1 part Maalox susp-1 part diphenhydramine 12.5 mg/19ml-1 part lidocaine 2%-1 part  Nystatin 100000 U/ml.         PROCEDURES     Procedures       ASSESSMENT     Encounter Diagnoses   Name Primary?    Suspected Covid-19 Virus Infection Yes    Viral pharyngitis           SSESSMENT    PLAN      43yo non-toxic appearing female with pharyngitis following a respiratory illness.RST and rapid Covid negative.  Will send throat cx and Covid swab to [facility].  No focal source of infection on exam, small ulcer at soft palate, but no tonsillar or pharyngeal edema or exudate.  No evidence of candida, abscess, or systemic reaction to antibiotic, however pt is extremely nervous about Levaquin reaction.  Will d/c Levaquin and switch to Augmentin.  Pain improved with GI cocktail. Will prescribe magic mouthwash for symptomatic treatment at home.  Pt unable to take oral steroids due to allergy.  Pt given strict return, f/u, and to ED guidelines.     Discussed results and diagnosis with patient/family.  Reviewed warning signs for worsening condition, as well as, indications for follow-up with pmd and return to urgent care clinic.   Patient/family expressed understanding of instructions.    Orders Placed This Encounter   Procedures  COVID-19 (SARS-CoV-2) Pre-Procedure, Team Member/First Responder    Throat Culture ([place])    Rapid Group A Strep         An After Visit Summary was printed and given to the patient.      Signed,  Evlyn Clines, PA

## 2019-04-10 NOTE — Progress Notes (Signed)
Subjective:      Patient ID: [name] [name]  is a 43 y.o.  female.   Verbal consent has been obtained from the patient to conduct a video and telephone visit to minimize exposure to COVID-19: yes  This visit is being conducted via video and telephone.    Chief Complaint   Patient presents with    Hospital Follow-up     c/o dry mouth, sore throat, nausea       HPI  [facility] [name] [name] presents via video today to FU from 04/07/19 ED visit  For chest pain and pleurisy She was prescribed an antibiotic anc d inhaler which she believes is helping, less chest pain noted. However, she is not with a sore throat and nausea. Difficulty swallowing food  She is afebrile. Questionable sense of taste, normal sense of smelll  Denies known  COVID 19 exposures  CXR reviewed, no pulmonary infiltrates noted  Past Medical History:   Diagnosis Date    Asthma     Attention deficit hyperactivity disorder (ADHD)     Convulsions     Seasonal allergic rhinitis          The following sections were reviewed this encounter by the provider:   Tobacco   Allergies   Meds   Problems   Med Hx   Surg Hx   Fam Hx         Review of Systems   Constitutional: Positive for fatigue. Negative for activity change, appetite change, chills and diaphoresis.   HENT: Positive for sore throat. Negative for congestion, ear pain, rhinorrhea and sinus pain.    Respiratory: Negative for cough, chest tightness and shortness of breath.    Gastrointestinal: Positive for nausea. Negative for abdominal pain, constipation and diarrhea.   Genitourinary: Negative.    Neurological: Negative for dizziness and headaches.         Physical Exam  Constitutional:       General: She is not in acute distress.     Appearance: Normal appearance. She is not ill-appearing.   HENT:      Mouth/Throat:      Comments: difficult to evaluate posterior pharynx with video exam  Pulmonary:      Effort: Pulmonary effort is normal.   Neurological:      Mental Status: She is alert and oriented  to person, place, and time.   Psychiatric:         Mood and Affect: Mood normal.         Behavior: Behavior normal.        Physical Exam is limited today due to the nature of the video visit    Assessment:   1. Sore throat         Plan:   Given her progression of sx, I reccommended UC evaluation and likely COVID 19 testing  Pt referred to [facility] respiratory clinic      Patient was advised to follow the CDC Guidelines for Steps to help prevent the spread of COVID-19 if you are sick.  Reviewed the following CDC Guidelines with the patient:      Self-isolate at home except to get medical care  ? Call ahead before getting medical care  ? Call if symptoms get worse  ? Avoid using public transportation   Separate from people and animals in home as much as possible which includes using a separate room and bathroom if possible  ? Use specific sick room   ? Restrict contact  with pets and animals   Call ahead before visiting the doctor   Wear a facemask  ? When around other people  ? Before entering healthcare provider's office  ? Caregivers should wear facemask   Cover coughs and sneezes  ? Use a tissue to cover a cough or sneeze  ? Throw used tissues in lined trash can  ? Wash hands immediately for at least 20 seconds or use hand sanitizer   Clean hands often  ? Soap and water for 20 seconds or alcohol-based hand sanitizer containing at least 60% alcohol  ? After blowing nose coughing, sneezing, going to the bathroom, before eating/preparing food  ? Avoid touching eyes, nose, mouth   Avoid sharing personal household items  ? Do not share dishes, drinking glasses, cups, utensils, towels or bedding  ? Wash all items with soap and water/put in dishwasher   Clean all high-touch surfaces every day  ? Clean isolation area  ? Caregiver to clean and disinfect other areas of the home  ? Caregiver to wear mask if cleaning sick isolation areas  ? Clean all areas that may have blood, stool or body fluids  ? Use soap and  water, household disinfectant--follow label instructions   Monitoring symptoms and contacting primary care   ? Seek medical care immediately if illness worsens  ? Call before entering doctor's office/emergency room  ? Wear a mask if possible when seeking care and keep at least 6 feet away from others  ? Follow care instructions from healthcare provider  ? Monitor emergency warning signs--difficulty breaking or shortness of breath, persistent pain or pressure in the chest, new confusion or difficulty to arouse, bluish lips or face, etc.   ? Call 911 if having an emergency   Do not discontinue home isolation until directed by primary care provider or health department  ? General guidance:     If symptoms are present:   Self-isolate until at least 1 days (24 hours) have passed since recovery defined as resolution of fever without the use of fever-reducing medications and improvement in respiratory symptoms (e.g., cough, shortness of breath); and, at least 10 days have passed since symptoms first appeared OR   If no symptoms are present:   Self-isolate until at least 10 days have passed since the date of their first positive COVID-19 test and have had no subsequent illness       The CDC guidelines for returning to work:    If your COVID-19 Test is NEGATIVE you may return to work if your symptoms are improving. You should also be without a fever (Temp <100 F) without fever reducing medications for > 24 hours prior to returning to work.    If your COVID-19 test is POSITIVE you may return to work 10 days from symptom onset as long as your symptoms are improving (ie, cough resolved). You should also be without a fever (Temp <100 F) without fever reducing medications for 24 hours prior to returning to work.         Time spent in discussion: 20 minutes    Risk & Benefits of any new medication(s) were explained to the patient who verbalized understanding & agreed to the treatment plan.     Call if symptoms persist,  worsen,change, questions or concerns      Beatris Si, MPH

## 2019-04-10 NOTE — Patient Instructions (Signed)
Acute Viral Pharyngitis (Sore Throat)    You or your child have a sore throat (pharyngitis). This infection is caused by a virus. Itcan cause throat pain that is worse when swallowing, aching all over, headache,and fever. The infection may be spread by coughing, kissing,or touching others after touching your mouth or nose. Antibiotic medicines don't work against viruses. They are not used for treating this illness.   Home care   If symptoms are severe, you or your child should rest at home. Return to work or school when you, or your child, feel well enough.   You or your child should drink plenty of fluids to prevent dehydration.   Adults, and children 5 years and older, can use throat lozenges or numbing throat sprays to help reduce pain. Gargling with warm salt water will also help reduce throat pain. Dissolve 1/2 teaspoon of salt in 1 glass of warm water. Children can sip on juice or an ice pop. Children 5 years and older can also suck on a lollipop or hard candy. (Hard candy and lozenges can be a choking hazard in children younger than 5 years.)   Don't eat salty or spicy foods or give them to your child. These can be irritating to the throat.  Medicines for a child: You can give your child acetaminophen for fever, fussiness, or discomfort. In babies over 6 months of age, you may use ibuprofen as well as acetaminophen. If your child has chronic liver or kidney disease or ever had a stomach ulcer or gastrointestinal bleeding, talk with your child's healthcare provider before giving these medicines. Aspirin should never be used by any child under 18 years of age who has a fever. It may cause severe liver damage and death. Don't give your child any other medicine without first asking your child's healthcare provider, especially the first time.   Medicines for an adult: You may use acetaminophen, naproxen, or ibuprofen to control pain or fever, unless another medicine was prescribed for this. If you have  chronic liver or kidney disease or ever had a stomach ulcer or gastrointestinal bleeding, talk with your healthcare provider before using these medicines.   Follow-up care  Follow up with a healthcare provider or as advised if you or your child are not getting better over the next week.   When to get medical advice  Call your healthcare provider right away if any of these occur:    Fever (see Fever and children, below)   New or worsening ear pain, sinus pain, or headache   Painful lumps in the back of neck   Stiff neck   Lymph nodes are getting larger   Can't open mouth wide due to throat pain   New rash   Other symptoms are getting worse  Call 911  Call 911 or get medical care right away if any of these occur:      Trouble breathing or noisy breathing   Muffled voice   Can't swallow liquids, a lot of drooling, or any other symptoms that may mean worsening swelling in the throat   Signs of dehydration such as very dark urine or no urine, sunken eyes, dizziness    Fever and children  Use a digital thermometer to check your child's temperature. Don't use a mercury thermometer. There are different kinds and uses of digital thermometers. They include:    Rectal. For children younger than 3 years, a rectal temperature is the most accurate.   Forehead (temporal).  This works   for children age 3 months and older. If a child under 3 months old has signs of illness, this can be used for a first pass. The provider may want to confirm with a rectal temperature.   Ear (tympanic). Ear temperatures are accurate after 6 months of age, but not before.   Armpit (axillary).  This is the least reliable but may be used for a first pass to check a child of any age with signs of illness. The provider may want to confirm with a rectal temperature.   Mouth (oral). Don't use a thermometer in your child's mouth until he or she is at least 44 years old.  Use the rectal thermometer with care. Follow the product maker's  directions for correct use. Insert it gently. Label it and make sure it's not used in the mouth. It may pass on germs from the stool. If you don't feel OK using a rectal thermometer, ask the healthcare provider what type to use instead. When you talk with any healthcare provider about your child's fever, tell him or her which type you used.   Below are guidelines to know if your young child has a fever. Your child's healthcare provider may give you different numbers for your child. Follow your provider's specific instructions.   Fever readings for a baby under 3 months old:    First, ask your child's healthcare provider how you should take the temperature.   Rectal or forehead: 100.4F (38C) or higher   Armpit: 99F (37.2C) or higher  Fever readings for a child age 3 months to 36 months (3 years):    Rectal, forehead, or ear: 102F (38.9C) or higher   Armpit: 101F (38.3C) or higher  Call the healthcare provider in these cases:    Repeated temperature of 104F (40C) or higher in a child of any age   Fever of 100.4 or higher in baby younger than 3 months   Fever that lasts more than 24 hours in a child under age 2   Fever that lasts for 3 days in a child age 2 or older  [employer] last reviewed this educational content on 08/17/2018   2000-2020 [employer] [address], [place] [address]. All rights reserved. This information is not intended as a substitute for professional medical care. Always follow your healthcare professional's instructions.

## 2019-04-11 LAB — COVID-19 (SARS-COV-2): SARS CoV 2 Overall Result: NOT DETECTED

## 2019-04-12 ENCOUNTER — Emergency Department
Admission: EM | Admit: 2019-04-12 | Discharge: 2019-04-12 | Disposition: A | Discharge status: Home / Self Care | Payer: 59 | Attending: Emergency Medicine | Admitting: Emergency Medicine

## 2019-04-12 DIAGNOSIS — B370 Candidal stomatitis: Secondary | ICD-10-CM | POA: Insufficient documentation

## 2019-04-12 DIAGNOSIS — J029 Acute pharyngitis, unspecified: Secondary | ICD-10-CM

## 2019-04-12 MED ORDER — CLOTRIMAZOLE 10 MG MT TROC
10.00 mg | Freq: Every day | OROMUCOSAL | 0 refills | Status: AC
Start: 2019-04-12 — End: 2019-04-19

## 2019-04-12 NOTE — ED Provider Notes (Signed)
EMERGENCY DEPARTMENT HISTORY AND PHYSICAL EXAM     Physician/Midlevel provider first contact with patient: 04/12/19 1610         Date: 04/12/2019  Patient Name: [name] [name]  Attending Physician:  [name] Rubens, MD  PPE with mask, face shield, and gloves worn every time I entered the room.    History of Presenting Illness     Chief Complaint   Patient presents with    Thrush       History Provided By: Patient    Chief Complaint: sore throat, thrush  Onset: several days ago  Timing: progressive  Location: throat  Quality: sharp  Severity: 6/10  Modifying Factors: none   Associated Symptoms: concern for thrush    Additional History: [name] [name] is a 43 y.o. female who presents with concern for thrush. Sore throat x several days, noticing white patches on her tongue and orpharyngeal pain.  Has asthma, uses inhaled corticosteroids, no HIV history.  No stridor, no SOB, no chest pain, no difficulty with neck movements, no drooling.  Was eval'd by Urgent Care, initially given Levaquin then switched to Augmentin 2 days ago for sore throat.  Has been using Magic Mouthwash without improvement.        PCP: [name] Rutherford, PA  SPECIALISTS: NA    Past Medical History     Past Medical History:   Diagnosis Date    Asthma     Attention deficit hyperactivity disorder (ADHD)     Convulsions     Seasonal allergic rhinitis      Past Surgical History:   Procedure Laterality Date    THUMB, BASILAR SCOPE WITH DEBRIDEMENT AND LIGAMENT RECONSTRUCTION Left 2015       Family History     Family History   Problem Relation Age of Onset    Cancer Mother         breast cancer    Lymphoma Mother     Heart disease Father     Cancer Maternal Grandmother         breast cancer       Social History     Social History     Tobacco Use    Smoking status: Never Smoker    Smokeless tobacco: Never Used   Substance Use Topics    Alcohol use: No    Drug use: No       Allergies     Allergies   Allergen Reactions    Dust Mite  Extract     Mold Extract [Trichophyton]     Prednisone Other (See Comments)     Mood changes  ICS- OK     Grass Extracts [Gramineae Pollens]     Losartan      dizzy    Other      All steroid       Review of Systems     Constitutional:  No fevers or chills.  ENT:  No congestion.  Eyes:  No vision changes.  Cardiovascular:  No chest pain  Respiratory:  No shortness of breath  Gastrointestinal:  No nausea, vomiting, diarrhea, or constipation. No abdominal pain.  Musculoskeletal:  No back pain  Skin:  No skin rash.  Neurological:  No headache.        Physical Exam     Vitals:    04/12/19 0219   BP: 166/77   Pulse: 79   Resp: 18   Temp: 98.1 F (36.7 C)   TempSrc: Oral  SpO2: 95%   Weight: 138.3 kg   Height: 5\' 3"  (1.6 m)       General: Pleasantly conversant and does not appear in acute distress.  Well developed and well nourished.  Head: Atraumatic, normocephalic.  Eyes: Extraocular movements are intact.  No proptosis.  Anicteric.  No conjunctival injection.  ENT: No active bleeding/discharge from bilateral nares.  Moist mucous membranes.  Oropharynx with several white patches over tongue which can be scraped off.  No uvular deviation. No palatal elevation.  No pain with palpation of submental space, no brawny unduration.  Patent airway with normal phonation and no stridor.    Neck: Supple.  No appreciable JVD.  No appreciable thyromegaly.  Cardiac: Regular rate and rhythm, no murmurs, rubs, or gallops.  Pulmonary: Clear to auscultation bilaterally without wheezes, rales, or rhonchi.  Normal respiratory effort.  Extremities: 2+ radial pulses, cap refill less than 2 seconds.  No edema.  No clubbing.  Skin: Skin is warm and dry.  No rash.  Psychiatric:  Normal eye contact.  Normal rate, rhythm, and tone of speech.  Patient is calm and cooperative.  Neurologic: Ambulates with a steady gait. Patient is alert, has clear speech, has no facial droop, and moves all extremities symmetrically.    Diagnostic Study Results      Labs -     Results     ** No results found for the last 24 hours. **          Radiologic Studies -   Radiology Results (24 Hour)     ** No results found for the last 24 hours. **      .    Medical Decision Making   I am the first provider for this patient.    I reviewed the vital signs, available nursing notes, past medical history, past surgical history, family history and social history.    Pulse Oximetry Analysis - Normal 95% on RA    Old Medical Records: Old medical records.    Medical Decision Making: Presents for sore throat and concern for thrush. Afebrile, HD stable, non-toxic appearing.  No tongue elevation, brawny induration of submental space, stridor, respiratory distress or other concerning findings to suggest Ludwig's, RPA, or other life-threatening infections.  No e/o PTA.  Recent strep test negative.  Recent COVID test negative. Lungs CTAB, no fever, low suspicion for PNA. There are some white patches that scrape off and in setting of inhaled steroid use, suspect thrush.  Will start clotrimazole.  No HIV/immunocompromised history.  She is well-appearing and I feel safe for outpatient management with strict return precautions and close outpatient follow up.  Discussed all of the above with the patient, she verbalizes understanding and agreement with this plan.    Clinical Impression:   1. Thrush    2. Sore throat        ED Disposition     ED Disposition Condition Date/Time Comment    Discharge  Sat Apr 12, 2019  2:26 AM [name] [name] discharge to home/self care.    Condition at disposition: Stable              This note was generated by the Epic EMR system/ Dragon speech recognition and may contain inherent errors or omissions not intended by the user. Grammatical errors, random word insertions, deletions and pronoun errors  are occasional consequences of this technology due to software limitations. Not all errors are caught or corrected. If there are questions or concerns about  the content  of this note or information contained within the body of this dictation they should be addressed directly with the author for clarification       [name] Ewings, MD  04/12/19 1739

## 2019-04-12 NOTE — Discharge Instructions (Signed)
Thrush    You have been diagnosed with thrush.    Thrush is an infection of the mouth. It is caused by a kind of yeast called "Candida." Normally, there is a thick white coating in the mouth and on the tongue. The same yeast may cause a rash in the diaper area or under the neck.    Babies get thrush more often than adults. It can still happen in older children though. Thrush may be more common in people with immune system problems. This could include those getting chemotherapy or people with certain infections.     You should use a medicine for the infection. Be sure to use it as directed. The white coating in the mouth should start to go away in a week. The doctor may have prescribed nystatin (Mycostatin and others). This needs to be used several times a day. This medicine works best when it is in contact with the yeast in the mouth. Use a swab to put some of the medicine around the cheeks and tongue or swish the medicine in your mouth for 1 to 2 minutes before swallowing. Medicines taken once a day like fluconazole (Diflucan) do not need to keep touching the yeast.    YOU SHOULD SEEK MEDICAL ATTENTION IMMEDIATELY, EITHER HERE OR AT THE NEAREST EMERGENCY DEPARTMENT, IF ANY OF THE FOLLOWING OCCURS:   The thrush does not get better with the medicine or comes back after stopping the medicine.   There is blood in the stool.   New symptoms like belly pain or fever (temperature higher than 100.4F / 38C) develop.   You have severe difficulty swallowing or cannot drink fluids.               Pharyngitis    You have been diagnosed with pharyngitis.    Pharyngitis is an infection of the back of your throat. Most sore throats are caused by viruses and do not require antibiotics. Some sore throats are caused by bacteria. Antibiotics will help this type of sore throat. A test for Strep throat may be used to help in your diagnosis.    Symptoms of pharyngitis include fever (temperature higher than 100.4F / 38C),  sore throat, and a hoarse voice. If you have cold symptoms such as sneezing and coughing, runny nose, or congestion, your sore throat is more likely to be caused by a virus and not bacteria.    Whether your sore throat is caused by a virus or bacteria, you may need medication for pain and fever. You should also drink a lot of fluid. If your sore throat is caused by bacteria, you will also need antibiotics. If your sore throat is caused by a virus, you do not need antibiotics. Antibiotics will not kill the virus and they may cause side-effects, like diarrhea, abdominal cramps, or allergic reactions. Taking an antibiotic that you do not need may cause "resistance," meaning that antibiotic won't work in the future when you have a true bacterial infection.    YOU SHOULD SEEK MEDICAL ATTENTION IMMEDIATELY, EITHER HERE OR AT THE NEAREST EMERGENCY DEPARTMENT, IF ANY OF THE FOLLOWING OCCURS:   You have difficulty breathing.   Your voices changes or seems muffled.   You have trouble swallowing.   You have a fever (temperature higher than 100.4F / 38C) that won't go away.   You feel worse or do not improve after 2 to 3 days.

## 2019-04-15 ENCOUNTER — Encounter (INDEPENDENT_AMBULATORY_CARE_PROVIDER_SITE_OTHER): Payer: Self-pay | Admitting: Physician Assistant

## 2019-04-15 ENCOUNTER — Other Ambulatory Visit (INDEPENDENT_AMBULATORY_CARE_PROVIDER_SITE_OTHER): Payer: Self-pay | Admitting: Physician Assistant

## 2019-04-15 MED ORDER — ALBUTEROL SULFATE (2.5 MG/3ML) 0.083% IN NEBU
2.50 mg | INHALATION_SOLUTION | RESPIRATORY_TRACT | 0 refills | Status: DC | PRN
Start: 2019-04-15 — End: 2019-04-16

## 2019-04-15 NOTE — Progress Notes (Signed)
Pt called requesting a refill   albuterol (PROVENTIL) (2.5 MG/3ML) 0.083% nebulizer solution    Would like a call back once is sent

## 2019-04-16 MED ORDER — ALBUTEROL SULFATE (2.5 MG/3ML) 0.083% IN NEBU
2.50 mg | INHALATION_SOLUTION | RESPIRATORY_TRACT | 0 refills | Status: DC | PRN
Start: 2019-04-16 — End: 2019-05-07

## 2019-04-25 ENCOUNTER — Other Ambulatory Visit: Payer: Self-pay | Admitting: Physician Assistant

## 2019-05-07 ENCOUNTER — Telehealth (INDEPENDENT_AMBULATORY_CARE_PROVIDER_SITE_OTHER): Payer: 59 | Admitting: Physician Assistant

## 2019-05-07 ENCOUNTER — Encounter (INDEPENDENT_AMBULATORY_CARE_PROVIDER_SITE_OTHER): Payer: Self-pay | Admitting: Physician Assistant

## 2019-05-07 DIAGNOSIS — J4520 Mild intermittent asthma, uncomplicated: Secondary | ICD-10-CM

## 2019-05-07 MED ORDER — ALBUTEROL SULFATE (2.5 MG/3ML) 0.083% IN NEBU
2.50 mg | INHALATION_SOLUTION | RESPIRATORY_TRACT | 0 refills | Status: DC | PRN
Start: 2019-05-07 — End: 2019-06-17

## 2019-05-07 MED ORDER — MONTELUKAST SODIUM 10 MG PO TABS
10.0000 mg | ORAL_TABLET | Freq: Every evening | ORAL | 1 refills | Status: DC
Start: 2019-05-07 — End: 2019-10-28

## 2019-05-07 NOTE — Progress Notes (Signed)
Subjective:      Patient ID: [name] [name]  is a 43 y.o.  female.   Verbal consent has been obtained from the patient to conduct a video and telephone visit to minimize exposure to COVID-19: yes  This visit is being conducted via video and telephone.    Chief Complaint   Patient presents with    Asthma     f/u      HPI  Asthma: [name] [name] presents for evaluation of asthma. Patient's symptoms include chest tightness and dyspnea. The patient has been suffering from these symptoms for approximately over a  month. Symptoms have been unchanged since their onset. Medications used in the past to treat these symptoms include beta agonist inhalers, inhaled steroids and nebulizer albuterol and Allegra. Suspected precipitants include recent bronchial infection, not dx as COVID 19 and allergies    She notes that she was previously on Singulair which she found very beneficial  The following sections were reviewed this encounter by the provider:   Tobacco   Allergies   Meds   Problems   Med Hx   Surg Hx   Fam Hx         Review of Systems   Constitutional: Positive for fatigue. Negative for diaphoresis and fever.   HENT: Positive for congestion, postnasal drip, rhinorrhea and sneezing. Negative for ear pain, sinus pressure and sore throat.    Respiratory: Positive for chest tightness and shortness of breath. Negative for cough (at times) and wheezing.    Cardiovascular: Negative for chest pain.   Gastrointestinal: Negative.    Skin: Negative.    Neurological: Negative for headaches.         Physical Exam  Constitutional:       General: She is not in acute distress.     Appearance: Normal appearance. She is not ill-appearing.   Pulmonary:      Effort: Pulmonary effort is normal.   Neurological:      Mental Status: She is alert and oriented to person, place, and time.   Psychiatric:         Mood and Affect: Mood normal.         Behavior: Behavior normal.        Physical Exam is limited today due to the nature of the  video visit    Assessment:   1. Mild intermittent reactive airway disease without complication  - montelukast (SINGULAIR) 10 MG tablet; Take 1 tablet (10 mg total) by mouth nightly  Dispense: 90 tablet; Refill: 1  - albuterol (PROVENTIL) (2.5 MG/3ML) 0.083% nebulizer solution; Take 3 mLs (2.5 mg total) by nebulization every 4 (four) hours as needed for Wheezing or Shortness of Breath (coughing)  Dispense: 25 each; Refill: 0         Plan:   Cont current Rx  Add Singulair daily  If sx not improve din 2 weeks, will increase does of Flovent    Time spent in discussion: 20 minutes    Risk & Benefits of any new medication(s) were explained to the patient who verbalized understanding & agreed to the treatment plan.     Call if symptoms persist, worsen,change, questions or concerns      Beatris Si, MPH

## 2019-05-07 NOTE — Progress Notes (Signed)
Have you seen any specialists/other providers since your last visit with us?    No    Arm preference verified?   No    The patient is due for influenza vaccine and PCMH letter

## 2019-05-15 ENCOUNTER — Telehealth (INDEPENDENT_AMBULATORY_CARE_PROVIDER_SITE_OTHER): Payer: Self-pay | Admitting: Physician Assistant

## 2019-05-15 ENCOUNTER — Telehealth (INDEPENDENT_AMBULATORY_CARE_PROVIDER_SITE_OTHER): Payer: 59 | Admitting: Physician Assistant

## 2019-05-15 ENCOUNTER — Encounter (INDEPENDENT_AMBULATORY_CARE_PROVIDER_SITE_OTHER): Payer: Self-pay | Admitting: Physician Assistant

## 2019-05-15 DIAGNOSIS — J4520 Mild intermittent asthma, uncomplicated: Secondary | ICD-10-CM

## 2019-05-15 DIAGNOSIS — Z9109 Other allergy status, other than to drugs and biological substances: Secondary | ICD-10-CM

## 2019-05-15 MED ORDER — FLOVENT HFA 110 MCG/ACT IN AERO
1.00 | INHALATION_SPRAY | Freq: Two times a day (BID) | RESPIRATORY_TRACT | 5 refills | Status: DC
Start: 2019-05-15 — End: 2019-06-17

## 2019-05-15 NOTE — Progress Notes (Signed)
Subjective:      Patient ID: [name] [name]  is a 43 y.o.  female.   Verbal consent has been obtained from the patient to conduct a video and telephone visit to minimize exposure to COVID-19: yes  This visit is being conducted via video and telephone.    Chief Complaint   Patient presents with    Asthma      Asthma  She complains of cough and wheezing. This is a recurrent problem. The current episode started more than 1 month ago. The problem has been waxing and waning. The cough is productive of sputum (white). Pertinent negatives include no appetite change, dyspnea on exertion, fever, headaches, heartburn, malaise/fatigue, rhinorrhea, sore throat, sweats or weight loss. Her symptoms are aggravated by change in weather. Her symptoms are alleviated by beta-agonist. Her past medical history is significant for asthma.     Reports compliance with Flovent, Allegra  and Singulair    The following sections were reviewed this encounter by the provider:   Tobacco   Allergies   Meds   Problems   Med Hx   Surg Hx   Fam Hx         Review of Systems   Constitutional: Negative for appetite change, fever, malaise/fatigue and weight loss.   HENT: Negative for rhinorrhea and sore throat.    Respiratory: Positive for cough, chest tightness and wheezing.    Cardiovascular: Negative for dyspnea on exertion.   Gastrointestinal: Negative for heartburn.   Neurological: Negative for headaches.         Physical Exam  Constitutional:       General: She is not in acute distress.     Appearance: Normal appearance. She is not ill-appearing.   Pulmonary:      Effort: Pulmonary effort is normal.   Neurological:      Mental Status: She is alert and oriented to person, place, and time.   Psychiatric:         Mood and Affect: Mood normal.         Behavior: Behavior normal.        Physical Exam is limited today due to the nature of the video visit    Assessment:   1. Mild intermittent reactive airway disease without complication  - fluticasone  (Flovent HFA) 110 MCG/ACT inhaler; Inhale 1 puff into the lungs 2 (two) times daily  Dispense: 1 Inhaler; Refill: 5    2. Environmental allergies  - Ambulatory referral to Allergy         Plan:   Increase Flovent to bid  Cont all other Rx  Will refer to Pulm if sx persist  Allergy referral and advised to avoid allergen triggers      Time spent in discussion: 25 minutes    Risk & Benefits of any new medication(s) were explained to the patient who verbalized understanding & agreed to the treatment plan.     Call if symptoms persist, worsen,change, questions or concerns      Beatris Si, MPH

## 2019-05-15 NOTE — Telephone Encounter (Signed)
Patient doesn't need an insurance referral to see an specialist. Mailing physician orders to see Allergist to patient's home address.    Thanks,   Maryan Rued

## 2019-06-14 ENCOUNTER — Emergency Department
Admission: EM | Admit: 2019-06-14 | Discharge: 2019-06-14 | Disposition: A | Discharge status: Home / Self Care | Payer: 59 | Attending: Emergency Medical Services | Admitting: Emergency Medical Services

## 2019-06-14 DIAGNOSIS — S61211A Laceration without foreign body of left index finger without damage to nail, initial encounter: Secondary | ICD-10-CM | POA: Insufficient documentation

## 2019-06-14 DIAGNOSIS — Y92000 Kitchen of unspecified non-institutional (private) residence as  the place of occurrence of the external cause: Secondary | ICD-10-CM | POA: Insufficient documentation

## 2019-06-14 DIAGNOSIS — W458XXA Other foreign body or object entering through skin, initial encounter: Secondary | ICD-10-CM | POA: Insufficient documentation

## 2019-06-14 DIAGNOSIS — Y93G1 Activity, food preparation and clean up: Secondary | ICD-10-CM | POA: Insufficient documentation

## 2019-06-14 MED ORDER — BACITRACIN +/- ZINC 500 UNIT/GM EX OINT (WRAP)
TOPICAL_OINTMENT | Freq: Once | CUTANEOUS | Status: AC
Start: 2019-06-14 — End: 2019-06-14
  Administered 2019-06-14: 19:00:00 1 g via TOPICAL
  Filled 2019-06-14: qty 1

## 2019-06-14 NOTE — ED Provider Notes (Signed)
EMERGENCY DEPARTMENT HISTORY AND PHYSICAL EXAM    Date: 06/14/2019   Patient Name: [name] [name]  Attending Physician: He, Zadie Rhine, MD PhD   Advanced Practice Provider: Marlowe Kays    History of Presenting Illness     History Provided By: Patient  Chief Complaint:  Chief Complaint   Patient presents with    Finger laceration     [name] [name] is a 43 y.o. female presenting to the ED with left pointer gfinger laceration.  She states she was cutting cabbage approximately 20 minutes prior to arrival and cut the pad side of her left pointer finger.  She was unable to control the bleeding at home with pressure so came to the emergency department.  She states her tetanus shot is up-to-date.  No sensation loss numbness tingling or weakness.    Onset,Timing, Location, Quality, Severity, Exacerbating factors, Alleviating factors.  Associated symptoms and pertinent negative listed in ROS.  Review of Systems   Review of Systems   Skin:        Laceration to palmar side of left pointer finger.  Patient right-hand-dominant.   All other systems reviewed and are negative.    Physical Exam   Pulse (!) 108   BP 195/86   Resp 22   SpO2 95 %   Temp 98.3 F (36.8 C)    Pulse Oximetry Analysis - Normal SpO2: 95 % on RA    Physical Exam   Constitutional: She is oriented to person, place, and time. She appears well-developed and well-nourished.   HENT:   Head: Normocephalic and atraumatic.   Neck: Normal range of motion.   Cardiovascular: Normal rate, regular rhythm and normal heart sounds.   Pulmonary/Chest: Effort normal and breath sounds normal.   Musculoskeletal: Normal range of motion.        Hands:    Neurological: She is alert and oriented to person, place, and time.   Skin: Skin is warm and dry.   Psychiatric: She has a normal mood and affect.   Nursing note and vitals reviewed.    Past History     Past Medical History:  Past Medical History:   Diagnosis Date    Asthma     Attention deficit hyperactivity  disorder (ADHD)     Convulsions     Seasonal allergic rhinitis     Past Surgical History:  Past Surgical History:   Procedure Laterality Date    THUMB, BASILAR SCOPE WITH DEBRIDEMENT AND LIGAMENT RECONSTRUCTION Left 2015      Family/Social History:  She reports that she has never smoked. She has never used smokeless tobacco. She reports that she does not drink alcohol or use drugs.  Family History   Problem Relation Age of Onset    Cancer Mother         breast cancer    Lymphoma Mother     Heart disease Father     Cancer Maternal Grandmother         breast cancer    Allergies:  Allergies   Allergen Reactions    Dust Mite Extract     Levaquin [Levofloxacin] Shortness Of Breath    Mold Extract [Trichophyton]     Prednisone Other (See Comments)     Mood changes  ICS- OK     Grass Extracts [Gramineae Pollens]     Losartan      dizzy    Other      All steroid  Listed Medications on Arrival:  Home Medications     Med List Status: Complete Set By: Regan Lemming, RN at 06/14/2019  5:39 PM                albuterol (PROVENTIL HFA) 108 (90 Base) MCG/ACT inhaler     Inhale 2 puffs into the lungs every 4 (four) hours as needed for Wheezing     fexofenadine (ALLEGRA) 180 MG tablet     Take 180 mg by mouth daily     fluticasone (Flovent HFA) 110 MCG/ACT inhaler     Inhale 1 puff into the lungs 2 (two) times daily     montelukast (SINGULAIR) 10 MG tablet     Take 1 tablet (10 mg total) by mouth nightly     Nebulizer Misc     Dispense one nebulizer machine to include mask and tubing     Pseudoephedrine-guaiFENesin (MUCINEX D PO)     Take by mouth         Primary Care Provider: Roe Rutherford, PA     Diagnostic Study Results     Labs -     Results     ** No results found for the last 24 hours. **       Radiologic Studies -   Radiology Results (24 Hour)     ** No results found for the last 24 hours. **      .     Procedures   Procedures:   Procedures  Medical Decision Making   I am the first provider for this  patient. I reviewed the vital signs, available nursing notes, past medical history, past surgical history, family history and social history.  Old Medical Records: Nursing notes.   Vital Signs-I have reviewed the patient's vital signs.     No data found.    ED Course:   Area cleansed with NS gauze. Bleeding controlled with gel foam.        Medications Given in the ED:  ED Medication Orders (From admission, onward)    Start Ordered     Status Ordering Provider    06/14/19 1837 06/14/19 1836  bacitracin ointment  Once     Route: Topical     Last MAR action: Given Rosalie Gelpi E          Medications Prescribed:  Discharge Prescriptions     None          Provider Notes:    43 y.o. female coming in today complaining of superficial avulsion laceration of the pad side of her left pointer finger while chopping cabbage in the kitchen.  She was unable to control the bleeding at home so came to the emergency department.  It was actively bleeding upon arrival, sensation and range of motion intact.  Small amount of Gelfoam placed to control bleeding.  All bleeding stopped after application of Gelfoam.  Antibiotic ointment, nonadherent dressing, and bulky gauze dressing to finger, patient does have a primary care appointment on Tuesday with her doctor and will follow up for wound recheck at that time.  Her tetanus shot is up-to-date.      Return precautions,  appropriate follow up instructions were all reviewed with Patient. All questions answered.         Diagnosis     Clinical Impression:   1. Laceration of left index finger without foreign body without damage to nail, initial encounter        Treatment Plan:   ED  Disposition     ED Disposition Condition Date/Time Comment    Discharge  Sat Jun 14, 2019  6:33 PM [name] [name] discharge to home/self care.    Condition at disposition: Stable        _____________________________    CHART OWNERSHIPCharlyne Mom, FNP am the primary clinician of record.     Marlowe Kays, FNP  06/15/19 [phone]

## 2019-06-14 NOTE — Discharge Instructions (Signed)
Laceration, General Wound Care    If you had a local anesthetic, it will wear off in about 2 hours. Until then, be careful not to hurt yourself because of having less feeling in the area.    Your doctor may have given you a prescription for an antibiotic to prevent infection or for pain medicine. Fill the prescription and use the medicine as directed.    For the next 24 hours:   Keep the wound clean and dry to help prevent infection.    Keep the injured area elevated (lifted) to decrease swelling and pain   Starting tomorrow:   Take off old dressings every day. Then put on a clean, dry dressing.   If the dressing sticks to the wound, slightly moisten it with water so it comes off more easily.   To help remove a scab, cleanse the area with a mixture of half hydrogen peroxide and half water.    Unless you were told not to do so, you can place a thin layer of antibiotic ointment over the wound. If the causes irritation to your skin, stop using it and switch to another antibiotic ointment. Ask your doctor or pharmacist if you need help choosing an antibiotic ointment.   If needed, put a clean, dry bandage over the wound to protect it.   Do not allow your wound to soak in water. Depending on where your wound is, you may not be able to wash dishes, go swimming, or soak in a hot tub.    You may put an ice pack on the area at least 4 times each day.   Place some ice cubes in a re-sealable (Ziploc) bag and add some water.   Seal the bag closed.   Put a thin washcloth between the bag and the skin.    Apply the ice bag to the area for at least 20 minutes.    Never apply directly to skin.    Not all lacerations (cuts) will need antibiotics. Your doctor may have decided that you need antibiotics to prevent an infection. Be sure to fill the prescription and take all medicines as directed.    Return here or go to the nearest Emergency Department immediately if:   You see redness or swelling.   You see red  streaks redness around the wound.   Your wound smells bad.   Your wound has has a lot of drainage.   You have more pain that is not controlled with pain medicine.   You have fever (temperature higher than 100.4F or 38C) or chills.    If you can't follow up with your doctor, or if at any time you feel you need to be rechecked or seen again, come back here or go to the nearest emergency department.

## 2019-06-14 NOTE — EDIE (Signed)
COLLECTIVE?NOTIFICATION?06/14/2019 15:44?ZORYANA, MONROE D?MRN: [id number]    Criteria Met      5 + ED Visits in 12 Months    Security and Safety  No recent Security Events currently on file    ED Care Guidelines  There are currently no ED Care Guidelines for this patient. Please check your facility's medical records system.        Prescription Monitoring Program  060??- Narcotic Use Score  030??- Sedative Use Score  000??- Stimulant Use Score  190??- Overdose Risk Score  - All Scores range from 000-999 with 75% of the population scoring < 200 and on 1% scoring above 650  - The last digit of the narcotic, sedative, and stimulant score indicates the number of active prescriptions of that type  - Higher Use scores correlate with increased prescribers, pharmacies, mg equiv, and overlapping prescriptions  - Higher Overdose Risk Scores correlate with increased risk of unintentional overdose death   Concerning or unexpectedly high scores should prompt a review of the PMP record; this does not constitute checking PMP for prescribing purposes.      E.D. Visit Count (12 mo.)  Facility Visits   [place] [facility]: [facility] [place] [facility]   Total 5   Note: Visits indicate total known visits.      Recent Emergency Department Visit Summary  Date Facility [facility] Type Diagnoses or Chief Complaint   Jun 14, 2019 [place] [facility]: [facility]. [place] [facility]      Left finger tip cut off      Apr 12, 2019 [place] [facility]: [facility] [place] [facility]      thrush / swelling to throat      Acute pharyngitis, unspecified      Candidal stomatitis      Apr 07, 2019 [place] [facility]: [facility]. [place] [facility]      Shortness of breath;chest pain      Chest Pain      Shortness of Breath      Other chest pain      Shortness of breath      Cough      Pleurisy      Jan 24, 2019 [place] [facility]: [facility]  [place] [facility] [place] [facility]      Right flank pain      Flank Pain      Acute pancreatitis without necrosis or infection, unspecified      Right upper quadrant pain      Jan 22, 2019  Emergency Room: [facility] [place] [facility] [place] [facility]      Palpitation, High Blood Pressure      Palpitations      Elevated white blood cell count, unspecified      Other general symptoms and signs          Recent Inpatient Visit Summary  No recorded inpatient visits.     Care Team  Provider Specialty Phone Fax Service Dates   Duwayne Heck , M.D. Family Medicine   Current      Collective Portal  This patient has registered at the [facility] Emergency Room: [facility] Emergency Department   For more information visit: [email or web address]     PLEASE NOTE:     1.   Any care recommendations and other clinical information are provided as guidelines or for historical purposes only, and providers should exercise their own clinical judgment when providing care.    2.   You may  only use this information for purposes of treatment, payment or health care operations activities, and subject to the limitations of applicable [employer] Policies.    3.   You should consult directly with the organization that provided a care guideline or other clinical history with any questions about additional information or accuracy or completeness of information provided.    ? 2020 [employer] - [email or web address].com/

## 2019-06-14 NOTE — ED Triage Notes (Signed)
Cutting cabbage around 1500 today, sliced tip of left index finger, throbbing pain.

## 2019-06-17 ENCOUNTER — Encounter (INDEPENDENT_AMBULATORY_CARE_PROVIDER_SITE_OTHER): Payer: Self-pay | Admitting: Physician Assistant

## 2019-06-17 ENCOUNTER — Ambulatory Visit (INDEPENDENT_AMBULATORY_CARE_PROVIDER_SITE_OTHER): Payer: 59 | Admitting: Physician Assistant

## 2019-06-17 VITALS — BP 138/77 | HR 72 | Temp 98.2°F | Resp 12 | Ht 63.6 in | Wt 306.0 lb

## 2019-06-17 DIAGNOSIS — J4520 Mild intermittent asthma, uncomplicated: Secondary | ICD-10-CM

## 2019-06-17 DIAGNOSIS — S65511S Laceration of blood vessel of left index finger, sequela: Secondary | ICD-10-CM

## 2019-06-17 DIAGNOSIS — J4540 Moderate persistent asthma, uncomplicated: Secondary | ICD-10-CM

## 2019-06-17 MED ORDER — FLUTICASONE-SALMETEROL 250-50 MCG/DOSE IN AEPB
1.00 | INHALATION_SPRAY | Freq: Two times a day (BID) | RESPIRATORY_TRACT | 5 refills | Status: DC
Start: 2019-06-17 — End: 2019-07-16

## 2019-06-17 MED ORDER — ALBUTEROL SULFATE (2.5 MG/3ML) 0.083% IN NEBU
2.50 mg | INHALATION_SOLUTION | RESPIRATORY_TRACT | 1 refills | Status: AC | PRN
Start: 2019-06-17 — End: 2019-07-17

## 2019-06-17 NOTE — Progress Notes (Signed)
Subjective:      Patient ID: [name] [name]  is a 43 y.o.  female.     Chief Complaint   Patient presents with    ER Follow-up     ED visit on 06/14/19 IO:[id number] of left index finger without foreign body without damage to nail, initial encounter  .    Asthma     Pt c/o wheezing X 2 weeks.      HPI  [name] [name] presents to the office today to FU on left index finger laceration on 06/14/2019  She was seen in the ED, Gel foam applied  She is keeping the wound clean and changing dressing daily  Pain has improved    FU asthma. Sx have improved with Flovent , but she is still using Albuterol inhaler at least once a day and nebulizer qod  Compliant with Singulair daily  She has appt with allergist in 2 weeks  She is exercising daily, following healthy diet    Wt Readings from Last 3 Encounters:   06/17/19 138.8 kg (306 lb)   06/14/19 137 kg (302 lb)   04/12/19 138.3 kg (305 lb)       The following sections were reviewed this encounter by the provider:   Tobacco   Allergies   Meds   Problems   Med Hx   Surg Hx   Fam Hx         Review of Systems   Constitutional: Negative for activity change, appetite change, chills, diaphoresis, fatigue and fever.   Respiratory: Positive for chest tightness and wheezing. Negative for cough and shortness of breath.    Skin: Positive for wound.         BP 138/77 (BP Site: Right arm, Patient Position: Sitting, Cuff Size: Large)    Pulse 72    Temp 98.2 F (36.8 C) (Oral)    Resp 12    Ht 1.615 m (5' 3.6")    Wt 138.8 kg (306 lb)    LMP 05/12/2019 (Approximate)    SpO2 96%    BMI 53.19 kg/m     Objective:   Physical Exam  Constitutional:       Appearance: Normal appearance. She is obese. She is not ill-appearing.   HENT:      Head: Normocephalic.   Cardiovascular:      Rate and Rhythm: Normal rate and regular rhythm.   Pulmonary:      Effort: Pulmonary effort is normal. No respiratory distress.      Breath sounds: Normal breath sounds. No wheezing or rales.   Chest:       Chest wall: No tenderness.   Musculoskeletal:        Hands:    Neurological:      Mental Status: She is alert.          Assessment:   1. RAD (reactive airway disease) with wheezing, moderate persistent, uncomplicated  - fluticasone-salmeterol (Advair Diskus) 250-50 MCG/DOSE Aerosol Pwdr, Breath Activated; Inhale 1 puff into the lungs 2 (two) times daily  Dispense: 1 each; Refill: 5  - albuterol (PROVENTIL) (2.5 MG/3ML) 0.083% nebulizer solution; Take 3 mLs (2.5 mg total) by nebulization every 4 (four) hours as needed for Wheezing or Shortness of Breath (coughing)  Dispense: 25 each; Refill: 1    2. Mild intermittent reactive airway disease without complication  - albuterol (PROVENTIL) (2.5 MG/3ML) 0.083% nebulizer solution; Take 3 mLs (2.5 mg total) by nebulization every 4 (four) hours as needed for  Wheezing or Shortness of Breath (coughing)  Dispense: 25 each; Refill: 1    3. Laceration of blood vessel of left index finger, sequela         Plan:   Imbler Flovent and change to Advair 250/50bid  Is RAD does not improve then refer to [facility]  FU allergist as planned    Change dressing daily  Keep area protected  Call with any signs of infection      Risk & Benefits of any new medication(s) were explained to the patient who verbalized understanding & agreed to the treatment plan.     Call if symptoms persist, worsen, or change.  Call with updates/questions/concerns.    Beatris Si, MPH

## 2019-06-17 NOTE — Progress Notes (Signed)
Have you seen any specialists/other providers since your last visit with us?    No    Arm preference verified?   Yes    The patient is due for [employer] letter.

## 2019-06-24 ENCOUNTER — Encounter (INDEPENDENT_AMBULATORY_CARE_PROVIDER_SITE_OTHER): Payer: Self-pay | Admitting: Physician Assistant

## 2019-07-16 ENCOUNTER — Telehealth (INDEPENDENT_AMBULATORY_CARE_PROVIDER_SITE_OTHER): Payer: 59 | Admitting: Physician Assistant

## 2019-07-16 ENCOUNTER — Encounter (INDEPENDENT_AMBULATORY_CARE_PROVIDER_SITE_OTHER): Payer: Self-pay | Admitting: Physician Assistant

## 2019-07-16 DIAGNOSIS — F902 Attention-deficit hyperactivity disorder, combined type: Secondary | ICD-10-CM

## 2019-07-16 MED ORDER — NORTRIPTYLINE HCL 25 MG PO CAPS
25.00 mg | ORAL_CAPSULE | Freq: Every evening | ORAL | 0 refills | Status: DC
Start: 2019-07-16 — End: 2019-10-28

## 2019-07-16 NOTE — Progress Notes (Signed)
Subjective:      Patient ID: [name] [name]  is a 43 y.o.  female.   Verbal consent has been obtained from the patient to conduct a video and telephone visit to minimize exposure to COVID-19: yes  This visit is being conducted via video and telephone.    Chief Complaint   Patient presents with    ADHD     Follow up ,medication request UE:[id number] (PAMELOR) 25 MG capsule.      HPI  [facility] [name] presents via video today to discuss her ADHD.   Pt reports she was initially diagnosed at age 64 (see scanned) document). She has tried a variety of stimulants and Strattera but found to be unsuccessful.   She was later changed to Pamelor 25mg  which she has taken on/off for 10 years. She finds the Pamelor very beneficial for her ADHD sx  She has only a small amount of pills remaining and requesting refill       The following sections were reviewed this encounter by the provider:   Tobacco   Allergies   Meds   Problems   Med Hx   Surg Hx   Fam Hx         Review of Systems   Constitutional: Negative for activity change.   Psychiatric/Behavioral: Positive for decreased concentration. Negative for dysphoric mood and sleep disturbance. The patient is not nervous/anxious.          Physical Exam   Physical Exam is limited today due to the nature of the video visit    Assessment:   1. Attention deficit hyperactivity disorder (ADHD), combined type         Plan:   Refilled Pamelor  FU 3 months    Time spent in discussion: 20 minutes    Risk & Benefits of any new medication(s) were explained to the patient who verbalized understanding & agreed to the treatment plan.     Call if symptoms persist, worsen,change, questions or concerns      Beatris Si, MPH

## 2019-07-16 NOTE — Progress Notes (Signed)
Have you seen any specialists/other providers since your last visit with us?    No    Arm preference verified?   Yes    The patient is due for [employer] letter.

## 2019-08-07 ENCOUNTER — Encounter (INDEPENDENT_AMBULATORY_CARE_PROVIDER_SITE_OTHER): Payer: Self-pay | Admitting: Physician Assistant

## 2019-10-28 ENCOUNTER — Other Ambulatory Visit (INDEPENDENT_AMBULATORY_CARE_PROVIDER_SITE_OTHER): Payer: Self-pay | Admitting: Physician Assistant

## 2019-10-28 DIAGNOSIS — F902 Attention-deficit hyperactivity disorder, combined type: Secondary | ICD-10-CM

## 2019-10-28 DIAGNOSIS — J4520 Mild intermittent asthma, uncomplicated: Secondary | ICD-10-CM

## 2019-10-29 MED ORDER — MONTELUKAST SODIUM 10 MG PO TABS
10.00 mg | ORAL_TABLET | Freq: Every evening | ORAL | 1 refills | Status: AC
Start: 2019-10-29 — End: 2020-04-26

## 2019-10-29 MED ORDER — NORTRIPTYLINE HCL 25 MG PO CAPS
25.00 mg | ORAL_CAPSULE | Freq: Every evening | ORAL | 0 refills | Status: DC
Start: 2019-10-29 — End: 2020-01-14

## 2020-01-14 ENCOUNTER — Other Ambulatory Visit (INDEPENDENT_AMBULATORY_CARE_PROVIDER_SITE_OTHER): Payer: Self-pay | Admitting: Physician Assistant

## 2020-01-14 DIAGNOSIS — F902 Attention-deficit hyperactivity disorder, combined type: Secondary | ICD-10-CM

## 2020-12-20 ENCOUNTER — Ambulatory Visit: Payer: Self-pay | Admitting: Adult Health

## 2020-12-23 ENCOUNTER — Other Ambulatory Visit: Payer: Self-pay

## 2020-12-23 ENCOUNTER — Encounter: Payer: Self-pay | Admitting: Adult Health

## 2020-12-23 ENCOUNTER — Ambulatory Visit (INDEPENDENT_AMBULATORY_CARE_PROVIDER_SITE_OTHER): Payer: Managed Care, Other (non HMO) | Admitting: Adult Health

## 2020-12-23 ENCOUNTER — Other Ambulatory Visit: Payer: Self-pay | Admitting: Adult Health

## 2020-12-23 VITALS — BP 140/86 | HR 86 | Temp 98.1°F | Ht 63.39 in | Wt 328.6 lb

## 2020-12-23 DIAGNOSIS — Z8719 Personal history of other diseases of the digestive system: Secondary | ICD-10-CM | POA: Insufficient documentation

## 2020-12-23 DIAGNOSIS — F909 Attention-deficit hyperactivity disorder, unspecified type: Secondary | ICD-10-CM | POA: Diagnosis not present

## 2020-12-23 DIAGNOSIS — E559 Vitamin D deficiency, unspecified: Secondary | ICD-10-CM | POA: Insufficient documentation

## 2020-12-23 DIAGNOSIS — Z13220 Encounter for screening for lipoid disorders: Secondary | ICD-10-CM | POA: Diagnosis not present

## 2020-12-23 DIAGNOSIS — J4530 Mild persistent asthma, uncomplicated: Secondary | ICD-10-CM | POA: Insufficient documentation

## 2020-12-23 DIAGNOSIS — R7309 Other abnormal glucose: Secondary | ICD-10-CM

## 2020-12-23 LAB — COMPREHENSIVE METABOLIC PANEL
ALT: 31 U/L (ref 0–35)
AST: 21 U/L (ref 0–37)
Albumin: 4.3 g/dL (ref 3.5–5.2)
Alkaline Phosphatase: 60 U/L (ref 39–117)
BUN: 8 mg/dL (ref 6–23)
CO2: 29 mEq/L (ref 19–32)
Calcium: 8.9 mg/dL (ref 8.4–10.5)
Chloride: 99 mEq/L (ref 96–112)
Creatinine, Ser: 0.66 mg/dL (ref 0.40–1.20)
GFR: 105.57 mL/min (ref >60.00)
Glucose, Bld: 111 mg/dL — ABNORMAL HIGH (ref 70–99)
Potassium: 4.2 mEq/L (ref 3.5–5.1)
Sodium: 137 mEq/L (ref 135–145)
Total Bilirubin: 0.5 mg/dL (ref 0.2–1.2)
Total Protein: 7.2 g/dL (ref 6.0–8.3)

## 2020-12-23 LAB — LIPASE: Lipase: 42 U/L (ref 11.0–59.0)

## 2020-12-23 LAB — LIPID PANEL
Cholesterol: 202 mg/dL — ABNORMAL HIGH (ref 0–200)
HDL: 40 mg/dL (ref >39.00)
NonHDL: 161.73
Total CHOL/HDL Ratio: 5
Triglycerides: 248 mg/dL — ABNORMAL HIGH (ref 0.0–149.0)
VLDL: 49.6 mg/dL — ABNORMAL HIGH (ref 0.0–40.0)

## 2020-12-23 LAB — CBC WITH DIFFERENTIAL/PLATELET
Basophils Absolute: 0.1 10*3/uL (ref 0.0–0.1)
Basophils Relative: 0.5 % (ref 0.0–3.0)
Eosinophils Absolute: 0.2 10*3/uL (ref 0.0–0.7)
Eosinophils Relative: 1.5 % (ref 0.0–5.0)
HCT: 41.2 % (ref 36.0–46.0)
Hemoglobin: 13.3 g/dL (ref 12.0–15.0)
Lymphocytes Relative: 28.8 % (ref 12.0–46.0)
Lymphs Abs: 3 10*3/uL (ref 0.7–4.0)
MCHC: 32.4 g/dL (ref 30.0–36.0)
MCV: 83.5 fl (ref 78.0–100.0)
Monocytes Absolute: 0.9 10*3/uL (ref 0.1–1.0)
Monocytes Relative: 8.1 % (ref 3.0–12.0)
Neutro Abs: 6.5 10*3/uL (ref 1.4–7.7)
Neutrophils Relative %: 61.1 % (ref 43.0–77.0)
Platelets: 307 10*3/uL (ref 150.0–400.0)
RBC: 4.93 Mil/uL (ref 3.87–5.11)
RDW: 15.4 % (ref 11.5–15.5)
WBC: 10.6 10*3/uL — ABNORMAL HIGH (ref 4.0–10.5)

## 2020-12-23 LAB — LDL CHOLESTEROL, DIRECT: Direct LDL: 135 mg/dL

## 2020-12-23 LAB — TSH: TSH: 1.27 u[IU]/mL (ref 0.35–4.50)

## 2020-12-23 LAB — VITAMIN D 25 HYDROXY (VIT D DEFICIENCY, FRACTURES): VITD: 41.14 ng/mL (ref 30.00–100.00)

## 2020-12-23 LAB — AMYLASE: Amylase: 34 U/L (ref 27–131)

## 2020-12-23 NOTE — Progress Notes (Signed)
Direct LDL is within normal limits. Total cholesterol , and triglycerides and VLDL elevated.  Discuss lifestyle modification with patient e.g. increase exercise, fiber, fruits, vegetables, lean meat, and omega 3/fish intake and decrease saturated fat.  If patient following strict diet and exercise program already please schedule follow up appointment with primary care physician Vitamin D level is within normal limits.  TSH for thyroid within normal limits.  Amylase and lipase are within normal limits. CMP with elevated glucose please add on a hemoglobin A1c if able provider will enter lab order.  Please update patients pharmacy in chart

## 2020-12-23 NOTE — Patient Instructions (Addendum)
Psyllium oral capsule What is this medicine? PSYLLIUM (SIL i yum) is a bulk-forming fiber laxative. This medicine is used to treat constipation. Increasing fiber in the diet may also help lower cholesterol and promote heart health for some people. This medicine may be used for other purposes; ask your health care provider or pharmacist if you have questions. COMMON BRAND NAME(S): GenFiber, Konsyl, Metamucil, Metamucil MultiHealth, Natural Fiber Laxative, Reguloid What should I tell my health care provider before I take this medicine? They need to know if you have any of these conditions: blockage in your bowel difficulty swallowing inflammatory bowel disease stomach or intestine problems sudden change in bowel habits lasting more than 2 weeks an unusual or allergic reaction to psyllium, other medicines, dyes, or preservatives pregnant or trying or get pregnant breast-feeding How should I use this medicine? Take this medicine by mouth with a full glass of water. Follow the directions on the package labeling, or take as directed by your health care professional. Take your medicine at regular intervals. Do not take your medicine more often than directed. Talk to your pediatrician regarding the use of this medicine in children. While this drug may be prescribed for children as young as 65 years of age for selected conditions, precautions do apply. Overdosage: If you think you have taken too much of this medicine contact a poison control center or emergency room at once. NOTE: This medicine is only for you. Do not share this medicine with others. What if I miss a dose? If you miss a dose, take it as soon as you can. If it is almost time for your next dose, take only that dose. Do not take double or extra doses. What may interact with this medicine? Interactions are not expected. Take this product at least 2 hours before or after other medicines. This list may not describe all possible  interactions. Give your health care provider a list of all the medicines, herbs, non-prescription drugs, or dietary supplements you use. Also tell them if you smoke, drink alcohol, or use illegal drugs. Some items may interact with your medicine. What should I watch for while using this medicine? Check with your doctor or health care professional if your symptoms do not start to get better or if they get worse. Stop using this medicine and contact your doctor or health care professional if you have rectal bleeding or if you have to treat your constipation for more than 1 week. These could be signs of a more serious condition. Drink several glasses of water a day while you are taking this medicine. This will help to relieve constipation and prevent dehydration. What side effects may I notice from receiving this medicine? Side effects that you should report to your doctor or health care professional as soon as possible: allergic reactions like skin rash, itching or hives, swelling of the face, lips, or tongue breathing problems chest pain nausea, vomiting rectal bleeding trouble swallowing Side effects that usually do not require medical attention (report to your doctor or health care professional if they continue or are bothersome): bloating gas stomach cramps This list may not describe all possible side effects. Call your doctor for medical advice about side effects. You may report side effects to FDA at 1-800-FDA-1088. Where should I keep my medicine? Keep out of the reach of children. Store at room temperature between 15 and 30 degrees C (59 and 86 degrees F). Protect from moisture. Throw away any unused medicine after the expiration date. NOTE:  This sheet is a summary. It may not cover all possible information. If you have questions about this medicine, talk to your doctor, pharmacist, or health care provider.  2021 Elsevier/Gold Standard (2017-11-27 15:56:42) Health Maintenance,  Female Adopting a healthy lifestyle and getting preventive care are important in promoting health and wellness. Ask your health care provider about: The right schedule for you to have regular tests and exams. Things you can do on your own to prevent diseases and keep yourself healthy. What should I know about diet, weight, and exercise? Eat a healthy diet Eat a diet that includes plenty of vegetables, fruits, low-fat dairy products, and lean protein. Do not eat a lot of foods that are high in solid fats, added sugars, or sodium.   Maintain a healthy weight Body mass index (BMI) is used to identify weight problems. It estimates body fat based on height and weight. Your health care provider can help determine your BMI and help you achieve or maintain a healthy weight. Get regular exercise Get regular exercise. This is one of the most important things you can do for your health. Most adults should: Exercise for at least 150 minutes each week. The exercise should increase your heart rate and make you sweat (moderate-intensity exercise). Do strengthening exercises at least twice a week. This is in addition to the moderate-intensity exercise. Spend less time sitting. Even light physical activity can be beneficial. Watch cholesterol and blood lipids Have your blood tested for lipids and cholesterol at 46 years of age, then have this test every 5 years. Have your cholesterol levels checked more often if: Your lipid or cholesterol levels are high. You are older than 46 years of age. You are at high risk for heart disease. What should I know about cancer screening? Depending on your health history and family history, you may need to have cancer screening at various ages. This may include screening for: Breast cancer. Cervical cancer. Colorectal cancer. Skin cancer. Lung cancer. What should I know about heart disease, diabetes, and high blood pressure? Blood pressure and heart disease High blood  pressure causes heart disease and increases the risk of stroke. This is more likely to develop in people who have high blood pressure readings, are of African descent, or are overweight. Have your blood pressure checked: Every 3-5 years if you are 89-66 years of age. Every year if you are 33 years old or older. Diabetes Have regular diabetes screenings. This checks your fasting blood sugar level. Have the screening done: Once every three years after age 11 if you are at a normal weight and have a low risk for diabetes. More often and at a younger age if you are overweight or have a high risk for diabetes. What should I know about preventing infection? Hepatitis B If you have a higher risk for hepatitis B, you should be screened for this virus. Talk with your health care provider to find out if you are at risk for hepatitis B infection. Hepatitis C Testing is recommended for: Everyone born from [birth year] through 1966. Anyone with known risk factors for hepatitis C. Sexually transmitted infections (STIs) Get screened for STIs, including gonorrhea and chlamydia, if: You are sexually active and are younger than 46 years of age. You are older than 45 years of age and your health care provider tells you that you are at risk for this type of infection. Your sexual activity has changed since you were last screened, and you are at increased risk for  chlamydia or gonorrhea. Ask your health care provider if you are at risk. Ask your health care provider about whether you are at high risk for HIV. Your health care provider may recommend a prescription medicine to help prevent HIV infection. If you choose to take medicine to prevent HIV, you should first get tested for HIV. You should then be tested every 3 months for as long as you are taking the medicine. Pregnancy If you are about to stop having your period (premenopausal) and you may become pregnant, seek counseling before you get pregnant. Take 400 to 800  micrograms (mcg) of folic acid every day if you become pregnant. Ask for birth control (contraception) if you want to prevent pregnancy. Osteoporosis and menopause Osteoporosis is a disease in which the bones lose minerals and strength with aging. This can result in bone fractures. If you are 45 years old or older, or if you are at risk for osteoporosis and fractures, ask your health care provider if you should: Be screened for bone loss. Take a calcium or vitamin D supplement to lower your risk of fractures. Be given hormone replacement therapy (HRT) to treat symptoms of menopause. Follow these instructions at home: Lifestyle Do not use any products that contain nicotine or tobacco, such as cigarettes, e-cigarettes, and chewing tobacco. If you need help quitting, ask your health care provider. Do not use street drugs. Do not share needles. Ask your health care provider for help if you need support or information about quitting drugs. Alcohol use Do not drink alcohol if: Your health care provider tells you not to drink. You are pregnant, may be pregnant, or are planning to become pregnant. If you drink alcohol: Limit how much you use to 0-1 drink a day. Limit intake if you are breastfeeding. Be aware of how much alcohol is in your drink. In the [place] one drink equals one 12 oz bottle of beer (355 mL), one 5 oz glass of wine (148 mL), or one 1 oz glass of hard liquor (44 mL). General instructions Schedule regular health, dental, and eye exams. Stay current with your vaccines. Tell your health care provider if: You often feel depressed. You have ever been abused or do not feel safe at home. Summary Adopting a healthy lifestyle and getting preventive care are important in promoting health and wellness. Follow your health care provider's instructions about healthy diet, exercising, and getting tested or screened for diseases. Follow your health care provider's instructions on monitoring  your cholesterol and blood pressure. This information is not intended to replace advice given to you by your health care provider. Make sure you discuss any questions you have with your health care provider. Document Revised: 06/26/2018 Document Reviewed: 06/26/2018 Elsevier Patient Education  2021 [employer].

## 2020-12-23 NOTE — Progress Notes (Signed)
New Patient Office Visit  Subjective:  Patient ID: Brandy Christensen, female    DOB: January 24, 1976  Age: 45 y.o. MRN: [id number]  CC:  Chief Complaint  Patient presents with   New Patient (Initial Visit)    Pt wants labs drawn and states she has pain in the abdomen     HPI Brandy Christensen presents for new patient care, she was seen previously in [place]. She moved here to [place].   She has been treated since the 9th grade, she has been on Strattera for a while switching betwwen the Adderal. She was seeing psychiatry. She would like to see [facility] she will send me the clinic.    She takes nortriptyline as well 25 mg  for ADHD as well.   She has symbicort, and albuterol for asthma/ allergies she sees allergy / [facility]. She has an area that is tender near her umbilicus for 1.5 years, she has occasional tendernss at times.    Patient  denies any fever, body aches,chills, rash, chest pain, shortness of breath, nausea, vomiting, or diarrhea.  Denies dizziness, lightheadedness, pre syncopal or syncopal episodes.   History reviewed. No pertinent surgical history.  Family History  Problem Relation Age of Onset   Cancer Mother    Learning disabilities Father    Heart disease Father    Hearing loss Maternal Grandmother    Hearing loss Maternal Grandfather     Social History   Socioeconomic History   Marital status: Single    Spouse name: Not on file   Number of children: Not on file   Years of education: Not on file   Highest education level: Not on file  Occupational History   Not on file  Tobacco Use   Smoking status: Never   Smokeless tobacco: Never  Substance and Sexual Activity   Alcohol use: Not Currently   Drug use: Not Currently   Sexual activity: Not Currently  Other Topics Concern   Not on file  Social History Narrative   Not on file   Social Determinants of Health   Financial Resource Strain: Not on file  Food Insecurity: Not on file   Transportation Needs: Not on file  Physical Activity: Not on file  Stress: Not on file  Social Connections: Not on file  Intimate Partner Violence: Not on file    ROS Review of Systems  Constitutional: Negative.   HENT: Negative.    Respiratory: Negative.    Cardiovascular: Negative.   Gastrointestinal:  Positive for abdominal pain (at umbilicus intermittent for 1.5 years not worsening.). Negative for abdominal distention, anal bleeding, blood in stool, constipation, diarrhea, nausea, rectal pain and vomiting.  Genitourinary: Negative.   Musculoskeletal: Negative.   Skin: Negative.   Psychiatric/Behavioral:  Positive for decreased concentration. Negative for confusion, dysphoric mood, hallucinations, self-injury, sleep disturbance and suicidal ideas. The patient is hyperactive. The patient is not nervous/anxious.    Objective:   Today's Vitals: BP 140/86   Pulse 86   Temp 98.1 F (36.7 C)   Ht 5' 3.39" (1.61 m)   Wt (!) 328 lb 9.6 oz (149.1 kg)   LMP 12/16/2020   SpO2 94%   BMI 57.50 kg/m   Physical Exam Vitals reviewed.  Constitutional:      General: She is not in acute distress.    Appearance: She is well-developed. She is not diaphoretic.     Interventions: She is not intubated. HENT:     Head: Normocephalic and atraumatic.  Right Ear: External ear normal.     Left Ear: External ear normal.     Nose: Nose normal.     Mouth/Throat:     Pharynx: No oropharyngeal exudate.  Eyes:     General: Lids are normal. No scleral icterus.       Right eye: No discharge.        Left eye: No discharge.     Conjunctiva/sclera: Conjunctivae normal.     Right eye: Right conjunctiva is not injected. No exudate or hemorrhage.    Left eye: Left conjunctiva is not injected. No exudate or hemorrhage.    Pupils: Pupils are equal, round, and reactive to light.  Neck:     Thyroid: No thyroid mass or thyromegaly.     Vascular: Normal carotid pulses. No carotid bruit, hepatojugular  reflux or JVD.     Trachea: Trachea and phonation normal. No tracheal tenderness or tracheal deviation.     Meningeal: Brudzinski's sign and Kernig's sign absent.  Cardiovascular:     Rate and Rhythm: Normal rate and regular rhythm.     Pulses: Normal pulses.          Radial pulses are 2+ on the right side and 2+ on the left side.       Dorsalis pedis pulses are 2+ on the right side and 2+ on the left side.       Posterior tibial pulses are 2+ on the right side and 2+ on the left side.     Heart sounds: Normal heart sounds, S1 normal and S2 normal. Heart sounds not distant. No murmur heard.   No friction rub. No gallop.  Pulmonary:     Effort: Pulmonary effort is normal. No tachypnea, bradypnea, accessory muscle usage or respiratory distress. She is not intubated.     Breath sounds: Normal breath sounds. No stridor. No wheezing, rhonchi or rales.  Chest:     Chest wall: No tenderness.  Breasts:    Right: No supraclavicular adenopathy.     Left: No supraclavicular adenopathy.  Abdominal:     General: Bowel sounds are normal. There is no distension or abdominal bruit.     Palpations: Abdomen is soft. There is no shifting dullness, fluid wave, hepatomegaly, splenomegaly, mass or pulsatile mass.     Tenderness: There is no abdominal tenderness. There is no guarding or rebound.     Hernia: No hernia is present.     Comments: Obese abdomen   Musculoskeletal:        General: No tenderness, deformity or signs of injury. Normal range of motion.     Cervical back: Full passive range of motion without pain, normal range of motion and neck supple. No edema, erythema or rigidity. No spinous process tenderness or muscular tenderness. Normal range of motion.     Right lower leg: No edema.     Left lower leg: No edema.  Lymphadenopathy:     Head:     Right side of head: No submental, submandibular, tonsillar, preauricular, posterior auricular or occipital adenopathy.     Left side of head: No  submental, submandibular, tonsillar, preauricular, posterior auricular or occipital adenopathy.     Cervical: No cervical adenopathy.     Right cervical: No superficial, deep or posterior cervical adenopathy.    Left cervical: No superficial, deep or posterior cervical adenopathy.     Upper Body:     Right upper body: No supraclavicular or pectoral adenopathy.     Left upper body:  No supraclavicular or pectoral adenopathy.  Skin:    General: Skin is warm and dry.     Coloration: Skin is not jaundiced or pale.     Findings: No abrasion, bruising, burn, ecchymosis, erythema, lesion, petechiae or rash.     Nails: There is no clubbing.  Neurological:     Mental Status: She is alert and oriented to person, place, and time.     GCS: GCS eye subscore is 4. GCS verbal subscore is 5. GCS motor subscore is 6.     Cranial Nerves: No cranial nerve deficit.     Sensory: No sensory deficit.     Motor: No tremor, atrophy, abnormal muscle tone or seizure activity.     Coordination: Coordination normal.     Gait: Gait normal.     Deep Tendon Reflexes: Reflexes are normal and symmetric. Reflexes normal. Babinski sign absent on the right side. Babinski sign absent on the left side.     Reflex Scores:      Tricep reflexes are 2+ on the right side and 2+ on the left side.      Bicep reflexes are 2+ on the right side and 2+ on the left side.      Brachioradialis reflexes are 2+ on the right side and 2+ on the left side.      Patellar reflexes are 2+ on the right side and 2+ on the left side.      Achilles reflexes are 2+ on the right side and 2+ on the left side. Psychiatric:        Speech: Speech normal.        Behavior: Behavior normal.        Thought Content: Thought content normal.        Judgment: Judgment normal.    Assessment & Plan:   Problem List Items Addressed This Visit       Respiratory   Mild persistent asthma   Relevant Medications   SYMBICORT 160-4.5 MCG/ACT inhaler   albuterol  (VENTOLIN HFA) 108 (90 Base) MCG/ACT inhaler     Other   Attention deficit hyperactivity disorder (ADHD) - Primary   Relevant Orders   CBC with Differential/Platelet (Completed)   Lipid panel (Completed)   TSH (Completed)   Comprehensive metabolic panel (Completed)   Vitamin D deficiency   Relevant Orders   VITAMIN D 25 Hydroxy (Vit-D Deficiency, Fractures) (Completed)   History of pancreatitis   Relevant Orders   Amylase (Completed)   Lipase (Completed)    Outpatient Encounter Medications as of 12/23/2020  Medication Sig   albuterol (VENTOLIN HFA) 108 (90 Base) MCG/ACT inhaler Inhale 2 puffs into the lungs every 4 (four) hours as needed.   atomoxetine (STRATTERA) 80 MG capsule Take 80 mg by mouth daily.   nortriptyline (PAMELOR) 25 MG capsule SMARTSIG:1 Capsule(s) By Mouth Every Evening   SYMBICORT 160-4.5 MCG/ACT inhaler SMARTSIG:2 Puff(s) By Mouth Twice Daily   No facility-administered encounter medications on file as of 12/23/2020.    The patient is advised to begin progressive daily aerobic exercise program, follow a low fat, low cholesterol diet, attempt to lose weight, reduce exposure to stress, improve dietary compliance, continue current medications, continue current healthy lifestyle patterns, and return for routine annual checkups.   Needs CPE and PAP will schedule.  Declines further work up for abdominal pain noted at this time, she will folllow up if persists.   Red Flags discussed. The patient was given clear instructions to go to ER or  return to medical center if any red flags develop, symptoms do not improve, worsen or new problems develop. They verbalized understanding.  Follow-up: Return if symptoms worsen or fail to improve, for at any time for any worsening symptoms.   Marcille Buffy, FNP

## 2020-12-24 ENCOUNTER — Other Ambulatory Visit: Payer: Managed Care, Other (non HMO)

## 2020-12-24 DIAGNOSIS — R7309 Other abnormal glucose: Secondary | ICD-10-CM

## 2020-12-24 LAB — HEMOGLOBIN A1C: Hgb A1c MFr Bld: 6.9 % — ABNORMAL HIGH (ref 4.6–6.5)

## 2020-12-24 NOTE — Progress Notes (Signed)
Pt pharmacy has been updated  

## 2020-12-25 ENCOUNTER — Encounter: Payer: Self-pay | Admitting: Adult Health

## 2020-12-25 ENCOUNTER — Other Ambulatory Visit: Payer: Self-pay | Admitting: Adult Health

## 2020-12-25 DIAGNOSIS — F909 Attention-deficit hyperactivity disorder, unspecified type: Secondary | ICD-10-CM

## 2020-12-25 MED ORDER — NORTRIPTYLINE HCL 25 MG PO CAPS: ORAL_CAPSULE | ORAL | 1 refills | Status: DC

## 2020-12-27 NOTE — Progress Notes (Signed)
Hemoglobin A1c is 6.9 elevated diabetic, patient needs to work on aggressive dietary and lifestyle changes, weight loss.  Patient can come into the office for nurse visit to learn glucose monitoring system.  We will need to recheck in 3 months after aggressive diet and lifestyle changes a hemoglobin A1c.

## 2020-12-28 ENCOUNTER — Telehealth: Payer: Self-pay

## 2020-12-28 NOTE — Telephone Encounter (Signed)
Left message to call back for lab results.

## 2020-12-28 NOTE — Telephone Encounter (Signed)
PT called to return the phone call for lab results

## 2021-01-10 ENCOUNTER — Telehealth: Payer: Self-pay | Admitting: Adult Health

## 2021-01-10 NOTE — Telephone Encounter (Signed)
Patient physical on your schedule for 01/19/21 as she needs her physical soon. Patient's pcp out on leave

## 2021-01-10 NOTE — Telephone Encounter (Signed)
Provider on leave. Patient needing her physical done. The patient has been rescheduled twice.

## 2021-01-11 NOTE — Telephone Encounter (Signed)
Sorry for this work in please when schedule allows for me ok to schedule on my or another providers schedule for CPE before CPE with PCP

## 2021-01-12 ENCOUNTER — Encounter: Payer: Managed Care, Other (non HMO) | Admitting: Adult Health

## 2021-01-19 ENCOUNTER — Ambulatory Visit (INDEPENDENT_AMBULATORY_CARE_PROVIDER_SITE_OTHER): Payer: Managed Care, Other (non HMO) | Admitting: Internal Medicine

## 2021-01-19 ENCOUNTER — Other Ambulatory Visit: Payer: Self-pay

## 2021-01-19 ENCOUNTER — Other Ambulatory Visit (HOSPITAL_COMMUNITY)
Admission: RE | Admit: 2021-01-19 | Discharge: 2021-01-19 | Disposition: A | Discharge status: Home / Self Care | Payer: Managed Care, Other (non HMO) | Source: Ambulatory Visit | Attending: Internal Medicine | Admitting: Internal Medicine

## 2021-01-19 ENCOUNTER — Encounter: Payer: Self-pay | Admitting: Internal Medicine

## 2021-01-19 VITALS — BP 146/96 | HR 86 | Temp 98.1°F | Ht 63.07 in | Wt 329.4 lb

## 2021-01-19 DIAGNOSIS — Z1231 Encounter for screening mammogram for malignant neoplasm of breast: Secondary | ICD-10-CM | POA: Diagnosis not present

## 2021-01-19 DIAGNOSIS — N760 Acute vaginitis: Secondary | ICD-10-CM | POA: Insufficient documentation

## 2021-01-19 DIAGNOSIS — I152 Hypertension secondary to endocrine disorders: Secondary | ICD-10-CM | POA: Insufficient documentation

## 2021-01-19 DIAGNOSIS — Z0000 Encounter for general adult medical examination without abnormal findings: Secondary | ICD-10-CM | POA: Diagnosis not present

## 2021-01-19 DIAGNOSIS — Z1211 Encounter for screening for malignant neoplasm of colon: Secondary | ICD-10-CM

## 2021-01-19 DIAGNOSIS — Z124 Encounter for screening for malignant neoplasm of cervix: Secondary | ICD-10-CM | POA: Insufficient documentation

## 2021-01-19 DIAGNOSIS — Z803 Family history of malignant neoplasm of breast: Secondary | ICD-10-CM | POA: Insufficient documentation

## 2021-01-19 DIAGNOSIS — E1159 Type 2 diabetes mellitus with other circulatory complications: Secondary | ICD-10-CM | POA: Diagnosis not present

## 2021-01-19 MED ORDER — WEGOVY 2.4 MG/0.75ML ~~LOC~~ SOAJ: 2.4000 mg | SUBCUTANEOUS | 3 refills | Status: DC

## 2021-01-19 MED ORDER — OZEMPIC (0.25 OR 0.5 MG/DOSE) 2 MG/1.5ML ~~LOC~~ SOPN: PEN_INJECTOR | SUBCUTANEOUS | 2 refills | Status: DC

## 2021-01-19 MED ORDER — WEGOVY 1.7 MG/0.75ML ~~LOC~~ SOAJ: 1.7000 mg | SUBCUTANEOUS | 3 refills | Status: DC

## 2021-01-19 MED ORDER — WEGOVY 1 MG/0.5ML ~~LOC~~ SOAJ: 1.0000 mg | SUBCUTANEOUS | 2 refills | Status: DC

## 2021-01-19 NOTE — Progress Notes (Signed)
Chief Complaint  Patient presents with   Annual Exam   Annual  1. BP elevated today and A1C 6.9 was on metformin in the past does not think has htn or dm  Will f/u with PCP in 3 months to repeat labs  2. Also in 3 months want STD check  Pt needs f/u in 3 months for HTN, DM and wants full panel std rpr, hiv, hep C, herpes 1/2, hep B surface body and antigen, hep A, lipid  3. FH breast cancer in mom and m GM but mom tested and negative gene breast cancer and also pt was tested and negative history      Review of Systems  Constitutional:  Negative for weight loss.  HENT:  Negative for hearing loss.   Eyes:  Negative for blurred vision.  Respiratory:  Negative for shortness of breath.   Cardiovascular:  Negative for chest pain.  Gastrointestinal:  Negative for abdominal pain.  Musculoskeletal:  Negative for falls and joint pain.  Skin:  Negative for rash.  Neurological:  Negative for headaches.  Psychiatric/Behavioral:  Negative for depression.   Past Medical History:  Diagnosis Date   ADHD (attention deficit hyperactivity disorder)    No past surgical history on file. Family History  Problem Relation Age of Onset   Cancer Mother        breast   Learning disabilities Father    Heart disease Father    Hearing loss Maternal Grandmother    Breast cancer Maternal Grandmother    Hearing loss Maternal Grandfather    Social History   Socioeconomic History   Marital status: Single    Spouse name: Not on file   Number of children: Not on file   Years of education: Not on file   Highest education level: Not on file  Occupational History   Not on file  Tobacco Use   Smoking status: Never   Smokeless tobacco: Never  Substance and Sexual Activity   Alcohol use: Not Currently   Drug use: Not Currently   Sexual activity: Not Currently  Other Topics Concern   Not on file  Social History Narrative   Not on file   Social Determinants of Health   Financial Resource Strain:  Not on file  Food Insecurity: Not on file  Transportation Needs: Not on file  Physical Activity: Not on file  Stress: Not on file  Social Connections: Not on file  Intimate Partner Violence: Not on file   Current Meds  Medication Sig   albuterol (VENTOLIN HFA) 108 (90 Base) MCG/ACT inhaler Inhale 2 puffs into the lungs every 4 (four) hours as needed.   Semaglutide,0.25 or 0.5MG /DOS, (OZEMPIC, 0.25 OR 0.5 MG/DOSE,) 2 MG/1.5ML SOPN 0.25 weekly x 1 month then increase to 0.5x weekly x 1 month then wegovy   Semaglutide-Weight Management (WEGOVY) 1 MG/0.5ML SOAJ Inject 1 mg into the skin once a week. Week 9-12   Semaglutide-Weight Management (WEGOVY) 1.7 MG/0.75ML SOAJ Inject 1.7 mg into the skin once a week. Week 13-16   Semaglutide-Weight Management (WEGOVY) 2.4 MG/0.75ML SOAJ Inject 2.4 mg into the skin once a week. 17 and beyond   SYMBICORT 160-4.5 MCG/ACT inhaler SMARTSIG:2 Puff(s) By Mouth Twice Daily   Allergies  Allergen Reactions   Fluticasone Anaphylaxis    Throat closing, unable to breathe    Levofloxacin Anaphylaxis    Throat closing, unable to breathe    Losartan Other (See Comments)    Low diastolic, fainting    Prednisone  Other (See Comments)    Paranoia. Also allergic to most steroids    Recent Results (from the past 2160 hour(s))  CBC with Differential/Platelet     Status: Abnormal   Collection Time: 12/23/20 12:14 PM  Result Value Ref Range   WBC 10.6 (H) 4.0 - 10.5 K/uL   RBC 4.93 3.87 - 5.11 Mil/uL   Hemoglobin 13.3 12.0 - 15.0 g/dL   HCT 41.2 36.0 - 46.0 %   MCV 83.5 78.0 - 100.0 fl   MCHC 32.4 30.0 - 36.0 g/dL   RDW 15.4 11.5 - 15.5 %   Platelets 307.0 150.0 - 400.0 K/uL   Neutrophils Relative % 61.1 43.0 - 77.0 %   Lymphocytes Relative 28.8 12.0 - 46.0 %   Monocytes Relative 8.1 3.0 - 12.0 %   Eosinophils Relative 1.5 0.0 - 5.0 %   Basophils Relative 0.5 0.0 - 3.0 %   Neutro Abs 6.5 1.4 - 7.7 K/uL   Lymphs Abs 3.0 0.7 - 4.0 K/uL   Monocytes Absolute  0.9 0.1 - 1.0 K/uL   Eosinophils Absolute 0.2 0.0 - 0.7 K/uL   Basophils Absolute 0.1 0.0 - 0.1 K/uL  Lipid panel     Status: Abnormal   Collection Time: 12/23/20 12:14 PM  Result Value Ref Range   Cholesterol 202 (H) 0 - 200 mg/dL    Comment: ATP III Classification       Desirable:  < 200 mg/dL               Borderline High:  200 - 239 mg/dL          High:  > = 240 mg/dL   Triglycerides 248.0 (H) 0.0 - 149.0 mg/dL    Comment: Normal:  <150 mg/dLBorderline High:  150 - 199 mg/dL   HDL 40.00 >39.00 mg/dL   VLDL 49.6 (H) 0.0 - 40.0 mg/dL   Total CHOL/HDL Ratio 5     Comment:                Men          Women1/2 Average Risk     3.4          3.3Average Risk          5.0          4.42X Average Risk          9.6          7.13X Average Risk          15.0          11.0                       NonHDL 161.73     Comment: NOTE:  Non-HDL goal should be 30 mg/dL higher than patient's LDL goal (i.e. LDL goal of < 70 mg/dL, would have non-HDL goal of < 100 mg/dL)  TSH     Status: None   Collection Time: 12/23/20 12:14 PM  Result Value Ref Range   TSH 1.27 0.35 - 4.50 uIU/mL  VITAMIN D 25 Hydroxy (Vit-D Deficiency, Fractures)     Status: None   Collection Time: 12/23/20 12:14 PM  Result Value Ref Range   VITD 41.14 30.00 - 100.00 ng/mL  Amylase     Status: None   Collection Time: 12/23/20 12:14 PM  Result Value Ref Range   Amylase 34 27 - 131 U/L  Lipase     Status: None   Collection  Time: 12/23/20 12:14 PM  Result Value Ref Range   Lipase 42.0 11.0 - 59.0 U/L  Comprehensive metabolic panel     Status: Abnormal   Collection Time: 12/23/20 12:14 PM  Result Value Ref Range   Sodium 137 135 - 145 mEq/L   Potassium 4.2 3.5 - 5.1 mEq/L   Chloride 99 96 - 112 mEq/L   CO2 29 19 - 32 mEq/L   Glucose, Bld 111 (H) 70 - 99 mg/dL   BUN 8 6 - 23 mg/dL   Creatinine, Ser 0.66 0.40 - 1.20 mg/dL   Total Bilirubin 0.5 0.2 - 1.2 mg/dL   Alkaline Phosphatase 60 39 - 117 U/L   AST 21 0 - 37 U/L   ALT 31 0 -  35 U/L   Total Protein 7.2 6.0 - 8.3 g/dL   Albumin 4.3 3.5 - 5.2 g/dL   GFR 105.57 >60.00 mL/min    Comment: Calculated using the CKD-EPI Creatinine Equation (2021)   Calcium 8.9 8.4 - 10.5 mg/dL  LDL cholesterol, direct     Status: None   Collection Time: 12/23/20 12:14 PM  Result Value Ref Range   Direct LDL 135.0 mg/dL    Comment: Optimal:  <100 mg/dLNear or Above Optimal:  100-129 mg/dLBorderline High:  130-159 mg/dLHigh:  160-189 mg/dLVery High:  >190 mg/dL  Hemoglobin A1c     Status: Abnormal   Collection Time: 12/24/20 11:47 AM  Result Value Ref Range   Hgb A1c MFr Bld 6.9 (H) 4.6 - 6.5 %    Comment: Glycemic Control Guidelines for People with Diabetes:Non Diabetic:  <6%Goal of Therapy: <7%Additional Action Suggested:  >8%    Objective  Body mass index is 58.22 kg/m. Wt Readings from Last 3 Encounters:  01/19/21 (!) 329 lb 6.4 oz (149.4 kg)  12/23/20 (!) 328 lb 9.6 oz (149.1 kg)   Temp Readings from Last 3 Encounters:  01/19/21 98.1 F (36.7 C) (Oral)  12/23/20 98.1 F (36.7 C)   BP Readings from Last 3 Encounters:  01/19/21 (!) 146/96  12/23/20 140/86   Pulse Readings from Last 3 Encounters:  01/19/21 86  12/23/20 86    Physical Exam Vitals and nursing note reviewed.  Constitutional:      Appearance: Normal appearance. She is well-developed. She is morbidly obese.  HENT:     Head: Normocephalic and atraumatic.  Eyes:     Conjunctiva/sclera: Conjunctivae normal.     Pupils: Pupils are equal, round, and reactive to light.  Cardiovascular:     Rate and Rhythm: Normal rate and regular rhythm.     Heart sounds: Normal heart sounds. No murmur heard. Pulmonary:     Effort: Pulmonary effort is normal.     Breath sounds: Normal breath sounds.  Chest:  Breasts:    Breasts are symmetrical.     Right: Normal. No axillary adenopathy.     Left: Normal. No axillary adenopathy.  Abdominal:     Tenderness: There is no abdominal tenderness.  Genitourinary:    Exam  position: Supine.     Pubic Area: No rash.      Labia:        Right: No rash.        Left: No rash.      Vagina: Normal.     Cervix: Discharge present.     Uterus: Normal.      Adnexa: Right adnexa normal and left adnexa normal.  Lymphadenopathy:     Upper Body:     Right  upper body: No axillary adenopathy.     Left upper body: No axillary adenopathy.  Skin:    General: Skin is warm and dry.  Neurological:     General: No focal deficit present.     Mental Status: She is alert and oriented to person, place, and time. Mental status is at baseline.     Gait: Gait normal.  Psychiatric:        Attention and Perception: Attention and perception normal.        Mood and Affect: Mood and affect normal.        Speech: Speech normal.        Behavior: Behavior normal. Behavior is cooperative.        Thought Content: Thought content normal.        Cognition and Memory: Cognition and memory normal.        Judgment: Judgment normal.    Assessment  Plan  Annual physical exam Had J&J and 2 pfizer  Per pt had HPV vaccines in late 20s  Consider flu ,Tdap in future  Screening mammogram, encounter for - Plan: MM 3D SCREEN BREAST BILATERAL Encounter for screening colonoscopy - Plan: Ambulatory referral to Gastroenterology Routine cervical smear - Plan: Cytology - PAP( [place]), Urinalysis, Routine w reflex microscopic, Microalbumin / creatinine urine ratio, Cervicovaginal ancillary only( [place]),  Acute vaginitis - Plan: Cervicovaginal ancillary only( [place])  Rec healthy diet and exercise  Pt needs f/u in 3 months for HTN, DM and wants full panel std rpr, hiv, hep C, herpes 1/2, hep B surface body and antigen, hep A, lipid, A1C PCP Can you order these labs in future to f/u   Hypertension associated with diabetes ([place]) - Plan: Semaglutide-Weight Management (WEGOVY) 1.7 MG/0.75ML SOAJ, Semaglutide-Weight Management (WEGOVY) 1 MG/0.5ML SOAJ, Semaglutide-Weight Management (WEGOVY)  2.4 MG/0.75ML SOAJ, Semaglutide,0.25 or 0.5MG /DOS, (OZEMPIC, 0.25 OR 0.5 MG/DOSE,) 2 MG/1.5ML SOPN Pt in denial ab dx'es will disc with PCP in 3 months and do fasting labs again  Monitor BP   Morbid obesity ([place]) - Plan: Semaglutide-Weight Management (WEGOVY) 1.7 MG/0.75ML SOAJ, Semaglutide-Weight Management (WEGOVY) 1 MG/0.5ML SOAJ, Semaglutide-Weight Management (WEGOVY) 2.4 MG/0.75ML SOAJ, Semaglutide,0.25 or 0.5MG /DOS, (OZEMPIC, 0.25 OR 0.5 MG/DOSE,) 2 MG/1.5ML SOPN  FH: breast cancer in first degree relative    Provider: Dr. Olivia Mackie McLean-Scocuzza-[facility] Medicine

## 2021-01-19 NOTE — Patient Instructions (Addendum)
<130<80  Diabetes Mellitus and Nutrition, Adult When you have diabetes, or diabetes mellitus, it is very important to have healthy eating habits because your blood sugar (glucose) levels are greatly affected by what you eat and drink. Eating healthy foods in the right amounts, at about the same times every day, can help you: Control your blood glucose. Lower your risk of heart disease. Improve your blood pressure. Reach or maintain a healthy weight. What can affect my meal plan? Every person with diabetes is different, and each person has different needs for a meal plan. Your health care provider may recommend that you work with a dietitian to make a meal plan that is best for you. Your meal plan may vary depending on factors such as: The calories you need. The medicines you take. Your weight. Your blood glucose, blood pressure, and cholesterol levels. Your activity level. Other health conditions you have, such as heart or kidney disease. How do carbohydrates affect me? Carbohydrates, also called carbs, affect your blood glucose level more than any other type of food. Eating carbs naturally raises the amount of glucose in your blood. Carb counting is a method for keeping track of how many carbs you eat. Counting carbs is important to keep your blood glucose at a healthy level,especially if you use insulin or take certain oral diabetes medicines. It is important to know how many carbs you can safely have in each meal. This is different for every person. Your dietitian can help you calculate how manycarbs you should have at each meal and for each snack. How does alcohol affect me? Alcohol can cause a sudden decrease in blood glucose (hypoglycemia), especially if you use insulin or take certain oral diabetes medicines. Hypoglycemia can be a life-threatening condition. Symptoms of hypoglycemia, such as sleepiness, dizziness, and confusion, are similar to symptoms of having too much alcohol. Do not  drink alcohol if: Your health care provider tells you not to drink. You are pregnant, may be pregnant, or are planning to become pregnant. If you drink alcohol: Do not drink on an empty stomach. Limit how much you use to: 0-1 drink a day for women. 0-2 drinks a day for men. Be aware of how much alcohol is in your drink. In the [place] one drink equals one 12 oz bottle of beer (355 mL), one 5 oz glass of wine (148 mL), or one 1 oz glass of hard liquor (44 mL). Keep yourself hydrated with water, diet soda, or unsweetened iced tea. Keep in mind that regular soda, juice, and other mixers may contain a lot of sugar and must be counted as carbs. What are tips for following this plan?  Reading food labels Start by checking the serving size on the "Nutrition Facts" label of packaged foods and drinks. The amount of calories, carbs, fats, and other nutrients listed on the label is based on one serving of the item. Many items contain more than one serving per package. Check the total grams (g) of carbs in one serving. You can calculate the number of servings of carbs in one serving by dividing the total carbs by 15. For example, if a food has 30 g of total carbs per serving, it would be equal to 2 servings of carbs. Check the number of grams (g) of saturated fats and trans fats in one serving. Choose foods that have a low amount or none of these fats. Check the number of milligrams (mg) of salt (sodium) in one serving. Most people should  limit total sodium intake to less than 2,300 mg per day. Always check the nutrition information of foods labeled as "low-fat" or "nonfat." These foods may be higher in added sugar or refined carbs and should be avoided. Talk to your dietitian to identify your daily goals for nutrients listed on the label. Shopping Avoid buying canned, pre-made, or processed foods. These foods tend to be high in fat, sodium, and added sugar. Shop around the outside edge of the grocery  store. This is where you will most often find fresh fruits and vegetables, bulk grains, fresh meats, and fresh dairy. Cooking Use low-heat cooking methods, such as baking, instead of high-heat cooking methods like deep frying. Cook using healthy oils, such as olive, canola, or sunflower oil. Avoid cooking with butter, cream, or high-fat meats. Meal planning Eat meals and snacks regularly, preferably at the same times every day. Avoid going long periods of time without eating. Eat foods that are high in fiber, such as fresh fruits, vegetables, beans, and whole grains. Talk with your dietitian about how many servings of carbs you can eat at each meal. Eat 4-6 oz (112-168 g) of lean protein each day, such as lean meat, chicken, fish, eggs, or tofu. One ounce (oz) of lean protein is equal to: 1 oz (28 g) of meat, chicken, or fish. 1 egg.  cup (62 g) of tofu. Eat some foods each day that contain healthy fats, such as avocado, nuts, seeds, and fish. What foods should I eat? Fruits Berries. Apples. Oranges. Peaches. Apricots. Plums. Grapes. Mango. Papaya.Pomegranate. Kiwi. Cherries. Vegetables Lettuce. Spinach. Leafy greens, including kale, chard, collard greens, and mustard greens. Beets. Cauliflower. Cabbage. Broccoli. Carrots. Green beans.Tomatoes. Peppers. Onions. Cucumbers. Brussels sprouts. Grains Whole grains, such as whole-wheat or whole-grain bread, crackers, tortillas,cereal, and pasta. Unsweetened oatmeal. Quinoa. Brown or wild rice. Meats and other proteins Seafood. Poultry without skin. Lean cuts of poultry and beef. Tofu. Nuts. Seeds. Dairy Low-fat or fat-free dairy products such as milk, yogurt, and cheese. The items listed above may not be a complete list of foods and beverages you can eat. Contact a dietitian for more information. What foods should I avoid? Fruits Fruits canned with syrup. Vegetables Canned vegetables. Frozen vegetables with butter or cream  sauce. Grains Refined white flour and flour products such as bread, pasta, snack foods, andcereals. Avoid all processed foods. Meats and other proteins Fatty cuts of meat. Poultry with skin. Breaded or fried meats. Processed meat.Avoid saturated fats. Dairy Full-fat yogurt, cheese, or milk. Beverages Sweetened drinks, such as soda or iced tea. The items listed above may not be a complete list of foods and beverages you should avoid. Contact a dietitian for more information. Questions to ask a health care provider Do I need to meet with a diabetes educator? Do I need to meet with a dietitian? What number can I call if I have questions? When are the best times to check my blood glucose? Where to find more information: American Diabetes Association: diabetes.org Academy of Nutrition and Dietetics: www.eatright.[employer] of Diabetes and Digestive and Kidney Diseases: [place] Association of Diabetes Care and Education Specialists: www.diabeteseducator.org Summary It is important to have healthy eating habits because your blood sugar (glucose) levels are greatly affected by what you eat and drink. A healthy meal plan will help you control your blood glucose and maintain a healthy lifestyle. Your health care provider may recommend that you work with a dietitian to make a meal plan that is best for you.  Keep in mind that carbohydrates (carbs) and alcohol have immediate effects on your blood glucose levels. It is important to count carbs and to use alcohol carefully. This information is not intended to replace advice given to you by your health care provider. Make sure you discuss any questions you have with your healthcare provider. Document Revised: 06/10/2019 Document Reviewed: 06/10/2019 Elsevier Patient Education  2021 [place].  High Cholesterol  High cholesterol is a condition in which the blood has high levels of a white, waxy substance similar to fat  (cholesterol). The liver makes all the cholesterol that the body needs. The human body needs small amounts of cholesterol to help build cells. A person gets extra orexcess cholesterol from the food that he or she eats. The blood carries cholesterol from the liver to the rest of the body. If you have high cholesterol, deposits (plaques) may build up on the walls of your arteries. Arteries are the blood vessels that carry blood away from your heart. These plaques make the arteries narrowand stiff. Cholesterol plaques increase your risk for heart attack and stroke. Work withyour health care provider to keep your cholesterol levels in a healthy range. What increases the risk? The following factors may make you more likely to develop this condition: Eating foods that are high in animal fat (saturated fat) or cholesterol. Being overweight. Not getting enough exercise. A family history of high cholesterol (familial hypercholesterolemia). Use of tobacco products. Having diabetes. What are the signs or symptoms? There are no symptoms of this condition. How is this diagnosed? This condition may be diagnosed based on the results of a blood test. If you are older than 46 years of age, your health care provider may check your cholesterol levels every 4-6 years. You may be checked more often if you have high cholesterol or other risk factors for heart disease. The blood test for cholesterol measures: "Bad" cholesterol, or LDL cholesterol. This is the main type of cholesterol that causes heart disease. The desired level is less than 100 mg/dL. "Good" cholesterol, or HDL cholesterol. HDL helps protect against heart disease by cleaning the arteries and carrying the LDL to the liver for processing. The desired level for HDL is 60 mg/dL or higher. Triglycerides. These are fats that your body can store or burn for energy. The desired level is less than 150 mg/dL. Total cholesterol. This measures the total amount of  cholesterol in your blood and includes LDL, HDL, and triglycerides. The desired level is less than 200 mg/dL. How is this treated? This condition may be treated with: Diet changes. You may be asked to eat foods that have more fiber and less saturated fats or added sugar. Lifestyle changes. These may include regular exercise, maintaining a healthy weight, and quitting use of tobacco products. Medicines. These are given when diet and lifestyle changes have not worked. You may be prescribed a statin medicine to help lower your cholesterol levels. Follow these instructions at home: Eating and drinking  Eat a healthy, balanced diet. This diet includes: Daily servings of a variety of fresh, frozen, or canned fruits and vegetables. Daily servings of whole grain foods that are rich in fiber. Foods that are low in saturated fats and trans fats. These include poultry and fish without skin, lean cuts of meat, and low-fat dairy products. A variety of fish, especially oily fish that contain omega-3 fatty acids. Aim to eat fish at least 2 times a week. Avoid foods and drinks that have added sugar. Use  healthy cooking methods, such as roasting, grilling, broiling, baking, poaching, steaming, and stir-frying. Do not fry your food except for stir-frying.  Lifestyle  Get regular exercise. Aim to exercise for a total of 150 minutes a week. Increase your activity level by doing activities such as gardening, walking, and taking the stairs. Do not use any products that contain nicotine or tobacco, such as cigarettes, e-cigarettes, and chewing tobacco. If you need help quitting, ask your health care provider.  General instructions Take over-the-counter and prescription medicines only as told by your health care provider. Keep all follow-up visits as told by your health care provider. This is important. Where to find more information American Heart Association: www.heart.org National Heart, Lung, and Blood  Institute: https://wilson-eaton.com/ Contact a health care provider if: You have trouble achieving or maintaining a healthy diet or weight. You are starting an exercise program. You are unable to stop smoking. Get help right away if: You have chest pain. You have trouble breathing. You have any symptoms of a stroke. "BE FAST" is an easy way to remember the main warning signs of a stroke: B - Balance. Signs are dizziness, sudden trouble walking, or loss of balance. E - Eyes. Signs are trouble seeing or a sudden change in vision. F - Face. Signs are sudden weakness or numbness of the face, or the face or eyelid drooping on one side. A - Arms. Signs are weakness or numbness in an arm. This happens suddenly and usually on one side of the body. S - Speech. Signs are sudden trouble speaking, slurred speech, or trouble understanding what people say. T - Time. Time to call emergency services. Write down what time symptoms started. You have other signs of a stroke, such as: A sudden, severe headache with no known cause. Nausea or vomiting. Seizure. These symptoms may represent a serious problem that is an emergency. Do not wait to see if the symptoms will go away. Get medical help right away. Call your local emergency services (911 in the [place] Do not drive yourself to the hospital. Summary Cholesterol plaques increase your risk for heart attack and stroke. Work with your health care provider to keep your cholesterol levels in a healthy range. Eat a healthy, balanced diet, get regular exercise, and maintain a healthy weight. Do not use any products that contain nicotine or tobacco, such as cigarettes, e-cigarettes, and chewing tobacco. Get help right away if you have any symptoms of a stroke. This information is not intended to replace advice given to you by your health care provider. Make sure you discuss any questions you have with your healthcare provider. Document Revised: 06/02/2019 Document Reviewed:  06/02/2019 Elsevier Patient Education  [place].  Cholesterol Content in Foods Cholesterol is a waxy, fat-like substance that helps to carry fat in the blood. The body needs cholesterol in small amounts, but too much cholesterol can causedamage to the arteries and heart. Most people should eat less than 200 milligrams (mg) of cholesterol a day. Foods with cholesterol  Cholesterol is found in animal-based foods, such as meat, seafood, and dairy. Generally, low-fat dairy and lean meats have less cholesterol than full-fat dairy and fatty meats. The milligrams of cholesterol per serving (mg per serving) of common cholesterol-containing foods are listed below. Meat and other proteins Egg -- one large whole egg has 186 mg. Veal shank -- 4 oz has 141 mg. Lean ground [place] (93% lean) -- 4 oz has 118 mg. Fat-trimmed lamb loin -- 4 oz has  106 mg. Lean ground beef (90% lean) -- 4 oz has 100 mg. Lobster -- 3.5 oz has 90 mg. Pork loin chops -- 4 oz has 86 mg. Canned salmon -- 3.5 oz has 83 mg. Fat-trimmed beef top loin -- 4 oz has 78 mg. Frankfurter -- 1 frank (3.5 oz) has 77 mg. Crab -- 3.5 oz has 71 mg. Roasted chicken without skin, white meat -- 4 oz has 66 mg. Light bologna -- 2 oz has 45 mg. Deli-cut [place] -- 2 oz has 31 mg. Canned tuna -- 3.5 oz has 31 mg. Berniece Salines -- 1 oz has 29 mg. Oysters and mussels (raw) -- 3.5 oz has 25 mg. Mackerel -- 1 oz has 22 mg. Trout -- 1 oz has 20 mg. Pork sausage -- 1 link (1 oz) has 17 mg. Salmon -- 1 oz has 16 mg. Tilapia -- 1 oz has 14 mg. Dairy Soft-serve ice cream --  cup (4 oz) has 103 mg. Whole-milk yogurt -- 1 cup (8 oz) has 29 mg. Cheddar cheese -- 1 oz has 28 mg. American cheese -- 1 oz has 28 mg. Whole milk -- 1 cup (8 oz) has 23 mg. 2% milk -- 1 cup (8 oz) has 18 mg. Cream cheese -- 1 tablespoon (Tbsp) has 15 mg. Cottage cheese --  cup (4 oz) has 14 mg. Low-fat (1%) milk -- 1 cup (8 oz) has 10 mg. Sour cream -- 1 Tbsp has 8.5  mg. Low-fat yogurt -- 1 cup (8 oz) has 8 mg. Nonfat Greek yogurt -- 1 cup (8 oz) has 7 mg. Half-and-half cream -- 1 Tbsp has 5 mg. Fats and oils Cod liver oil -- 1 tablespoon (Tbsp) has 82 mg. Butter -- 1 Tbsp has 15 mg. Lard -- 1 Tbsp has 14 mg. Bacon grease -- 1 Tbsp has 14 mg. Mayonnaise -- 1 Tbsp has 5-10 mg. Margarine -- 1 Tbsp has 3-10 mg. Exact amounts of cholesterol in these foods may vary depending on specificingredients and brands. Foods without cholesterol Most plant-based foods do not have cholesterol unless you combine them with a food that has cholesterol. Foods without cholesterol include: Grains and cereals. Vegetables. Fruits. Vegetable oils, such as olive, canola, and sunflower oil. Legumes, such as peas, beans, and lentils. Nuts and seeds. Egg whites. Summary The body needs cholesterol in small amounts, but too much cholesterol can cause damage to the arteries and heart. Most people should eat less than 200 milligrams (mg) of cholesterol a day. This information is not intended to replace advice given to you by your health care provider. Make sure you discuss any questions you have with your healthcare provider. Document Revised: 10/14/2019 Document Reviewed: 11/24/2019 Elsevier Patient Education  [place].

## 2021-01-20 LAB — CERVICOVAGINAL ANCILLARY ONLY
Bacterial Vaginitis (gardnerella): NEGATIVE
Candida Glabrata: NEGATIVE
Candida Vaginitis: NEGATIVE
Chlamydia: NEGATIVE
Comment: NEGATIVE
Comment: NEGATIVE
Comment: NEGATIVE
Comment: NEGATIVE
Comment: NEGATIVE
Comment: NORMAL
Neisseria Gonorrhea: NEGATIVE
Trichomonas: NEGATIVE

## 2021-01-20 LAB — URINALYSIS, ROUTINE W REFLEX MICROSCOPIC
Bilirubin Urine: NEGATIVE
Glucose, UA: NEGATIVE
Hgb urine dipstick: NEGATIVE
Ketones, ur: NEGATIVE
Leukocytes,Ua: NEGATIVE
Nitrite: NEGATIVE
Protein, ur: NEGATIVE
Specific Gravity, Urine: 1.019 (ref 1.001–1.035)
pH: 7 (ref 5.0–8.0)

## 2021-01-20 LAB — CYTOLOGY - PAP
Adequacy: ABSENT
Comment: NEGATIVE
Diagnosis: NEGATIVE
High risk HPV: NEGATIVE

## 2021-01-20 LAB — MICROALBUMIN / CREATININE URINE RATIO
Creatinine, Urine: 161 mg/dL (ref 20–275)
Microalb Creat Ratio: 14 mcg/mg creat (ref <30)
Microalb, Ur: 2.3 mg/dL

## 2021-03-10 ENCOUNTER — Ambulatory Visit
Admission: RE | Admit: 2021-03-10 | Discharge: 2021-03-10 | Disposition: A | Discharge status: Home / Self Care | Payer: Managed Care, Other (non HMO) | Source: Ambulatory Visit | Attending: Internal Medicine | Admitting: Internal Medicine

## 2021-03-10 ENCOUNTER — Other Ambulatory Visit: Payer: Self-pay

## 2021-03-10 DIAGNOSIS — Z1231 Encounter for screening mammogram for malignant neoplasm of breast: Secondary | ICD-10-CM | POA: Diagnosis not present

## 2021-03-19 ENCOUNTER — Other Ambulatory Visit: Payer: Self-pay | Admitting: Internal Medicine

## 2021-03-19 DIAGNOSIS — E1159 Type 2 diabetes mellitus with other circulatory complications: Secondary | ICD-10-CM

## 2021-03-19 DIAGNOSIS — I152 Hypertension secondary to endocrine disorders: Secondary | ICD-10-CM

## 2021-03-24 ENCOUNTER — Inpatient Hospital Stay
Admission: RE | Admit: 2021-03-24 | Discharge: 2021-03-24 | Disposition: A | Discharge status: Home / Self Care | Payer: Self-pay | Source: Ambulatory Visit | Attending: *Deleted | Admitting: *Deleted

## 2021-03-24 ENCOUNTER — Other Ambulatory Visit: Payer: Self-pay | Admitting: *Deleted

## 2021-03-24 DIAGNOSIS — Z1231 Encounter for screening mammogram for malignant neoplasm of breast: Secondary | ICD-10-CM

## 2021-04-14 ENCOUNTER — Other Ambulatory Visit: Payer: Self-pay | Admitting: Internal Medicine

## 2021-04-14 DIAGNOSIS — I152 Hypertension secondary to endocrine disorders: Secondary | ICD-10-CM

## 2021-04-14 DIAGNOSIS — E1159 Type 2 diabetes mellitus with other circulatory complications: Secondary | ICD-10-CM

## 2021-04-21 ENCOUNTER — Ambulatory Visit: Payer: Managed Care, Other (non HMO) | Admitting: Adult Health

## 2021-05-20 ENCOUNTER — Encounter: Payer: Managed Care, Other (non HMO) | Admitting: Gastroenterology

## 2021-06-24 ENCOUNTER — Ambulatory Visit: Payer: Managed Care, Other (non HMO) | Admitting: Gastroenterology

## 2021-06-27 ENCOUNTER — Encounter: Payer: Self-pay | Admitting: Internal Medicine

## 2021-06-27 NOTE — Progress Notes (Signed)
Please advise patient provider recommends her calling back to schedule her GI appointment, letter was sent by GI.

## 2021-06-29 ENCOUNTER — Ambulatory Visit: Payer: Managed Care, Other (non HMO) | Admitting: Internal Medicine

## 2021-06-29 ENCOUNTER — Ambulatory Visit (INDEPENDENT_AMBULATORY_CARE_PROVIDER_SITE_OTHER): Payer: Managed Care, Other (non HMO) | Admitting: Adult Health

## 2021-06-29 ENCOUNTER — Encounter: Payer: Self-pay | Admitting: Adult Health

## 2021-06-29 ENCOUNTER — Other Ambulatory Visit: Payer: Self-pay

## 2021-06-29 VITALS — BP 138/88 | HR 91 | Temp 96.7°F | Ht 63.07 in | Wt 335.4 lb

## 2021-06-29 DIAGNOSIS — E1159 Type 2 diabetes mellitus with other circulatory complications: Secondary | ICD-10-CM

## 2021-06-29 DIAGNOSIS — Z8719 Personal history of other diseases of the digestive system: Secondary | ICD-10-CM | POA: Diagnosis not present

## 2021-06-29 DIAGNOSIS — Z1211 Encounter for screening for malignant neoplasm of colon: Secondary | ICD-10-CM

## 2021-06-29 DIAGNOSIS — E559 Vitamin D deficiency, unspecified: Secondary | ICD-10-CM | POA: Diagnosis not present

## 2021-06-29 DIAGNOSIS — E118 Type 2 diabetes mellitus with unspecified complications: Secondary | ICD-10-CM | POA: Diagnosis not present

## 2021-06-29 DIAGNOSIS — I152 Hypertension secondary to endocrine disorders: Secondary | ICD-10-CM

## 2021-06-29 DIAGNOSIS — Z113 Encounter for screening for infections with a predominantly sexual mode of transmission: Secondary | ICD-10-CM

## 2021-06-29 DIAGNOSIS — E785 Hyperlipidemia, unspecified: Secondary | ICD-10-CM | POA: Diagnosis not present

## 2021-06-29 DIAGNOSIS — F909 Attention-deficit hyperactivity disorder, unspecified type: Secondary | ICD-10-CM

## 2021-06-29 LAB — LIPID PANEL
Cholesterol: 190 mg/dL (ref 0–200)
HDL: 40.7 mg/dL (ref >39.00)
LDL Cholesterol: 122 mg/dL — ABNORMAL HIGH (ref 0–99)
NonHDL: 148.85
Total CHOL/HDL Ratio: 5
Triglycerides: 136 mg/dL (ref 0.0–149.0)
VLDL: 27.2 mg/dL (ref 0.0–40.0)

## 2021-06-29 LAB — CBC WITH DIFFERENTIAL/PLATELET
Basophils Absolute: 0 10*3/uL (ref 0.0–0.1)
Basophils Relative: 0.5 % (ref 0.0–3.0)
Eosinophils Absolute: 0.2 10*3/uL (ref 0.0–0.7)
Eosinophils Relative: 1.6 % (ref 0.0–5.0)
HCT: 41.3 % (ref 36.0–46.0)
Hemoglobin: 13.3 g/dL (ref 12.0–15.0)
Lymphocytes Relative: 29.8 % (ref 12.0–46.0)
Lymphs Abs: 2.8 10*3/uL (ref 0.7–4.0)
MCHC: 32.1 g/dL (ref 30.0–36.0)
MCV: 83.5 fl (ref 78.0–100.0)
Monocytes Absolute: 0.7 10*3/uL (ref 0.1–1.0)
Monocytes Relative: 7.4 % (ref 3.0–12.0)
Neutro Abs: 5.8 10*3/uL (ref 1.4–7.7)
Neutrophils Relative %: 60.7 % (ref 43.0–77.0)
Platelets: 295 10*3/uL (ref 150.0–400.0)
RBC: 4.95 Mil/uL (ref 3.87–5.11)
RDW: 15.8 % — ABNORMAL HIGH (ref 11.5–15.5)
WBC: 9.6 10*3/uL (ref 4.0–10.5)

## 2021-06-29 LAB — VITAMIN D 25 HYDROXY (VIT D DEFICIENCY, FRACTURES): VITD: 41.5 ng/mL (ref 30.00–100.00)

## 2021-06-29 LAB — AMYLASE: Amylase: 63 U/L (ref 27–131)

## 2021-06-29 LAB — COMPREHENSIVE METABOLIC PANEL
ALT: 23 U/L (ref 0–35)
AST: 13 U/L (ref 0–37)
Albumin: 4.1 g/dL (ref 3.5–5.2)
Alkaline Phosphatase: 61 U/L (ref 39–117)
BUN: 9 mg/dL (ref 6–23)
CO2: 27 mEq/L (ref 19–32)
Calcium: 8.9 mg/dL (ref 8.4–10.5)
Chloride: 100 mEq/L (ref 96–112)
Creatinine, Ser: 0.66 mg/dL (ref 0.40–1.20)
GFR: 105.19 mL/min (ref >60.00)
Glucose, Bld: 117 mg/dL — ABNORMAL HIGH (ref 70–99)
Potassium: 4.1 mEq/L (ref 3.5–5.1)
Sodium: 136 mEq/L (ref 135–145)
Total Bilirubin: 0.4 mg/dL (ref 0.2–1.2)
Total Protein: 7.1 g/dL (ref 6.0–8.3)

## 2021-06-29 LAB — TSH: TSH: 2 u[IU]/mL (ref 0.35–5.50)

## 2021-06-29 LAB — LIPASE: Lipase: 184 U/L — ABNORMAL HIGH (ref 11.0–59.0)

## 2021-06-29 LAB — HEMOGLOBIN A1C: Hgb A1c MFr Bld: 6.5 % (ref 4.6–6.5)

## 2021-06-29 MED ORDER — SEMAGLUTIDE (1 MG/DOSE) 4 MG/3ML ~~LOC~~ SOPN: 1.0000 mg | PEN_INJECTOR | SUBCUTANEOUS | 2 refills | Status: DC

## 2021-06-29 MED ORDER — NORTRIPTYLINE HCL 25 MG PO CAPS: ORAL_CAPSULE | ORAL | 1 refills | Status: DC

## 2021-06-29 NOTE — Patient Instructions (Signed)
Health Maintenance, Female °Adopting a healthy lifestyle and getting preventive care are important in promoting health and wellness. Ask your health care provider about: °The right schedule for you to have regular tests and exams. °Things you can do on your own to prevent diseases and keep yourself healthy. °What should I know about diet, weight, and exercise? °Eat a healthy diet ° °Eat a diet that includes plenty of vegetables, fruits, low-fat dairy products, and lean protein. °Do not eat a lot of foods that are high in solid fats, added sugars, or sodium. °Maintain a healthy weight °Body mass index (BMI) is used to identify weight problems. It estimates body fat based on height and weight. Your health care provider can help determine your BMI and help you achieve or maintain a healthy weight. °Get regular exercise °Get regular exercise. This is one of the most important things you can do for your health. Most adults should: °Exercise for at least 150 minutes each week. The exercise should increase your heart rate and make you sweat (moderate-intensity exercise). °Do strengthening exercises at least twice a week. This is in addition to the moderate-intensity exercise. °Spend less time sitting. Even light physical activity can be beneficial. °Watch cholesterol and blood lipids °Have your blood tested for lipids and cholesterol at 46 years of age, then have this test every 5 years. °Have your cholesterol levels checked more often if: °Your lipid or cholesterol levels are high. °You are older than 46 years of age. °You are at high risk for heart disease. °What should I know about cancer screening? °Depending on your health history and family history, you may need to have cancer screening at various ages. This may include screening for: °Breast cancer. °Cervical cancer. °Colorectal cancer. °Skin cancer. °Lung cancer. °What should I know about heart disease, diabetes, and high blood pressure? °Blood pressure and heart  disease °High blood pressure causes heart disease and increases the risk of stroke. This is more likely to develop in people who have high blood pressure readings or are overweight. °Have your blood pressure checked: °Every 3-5 years if you are 18-39 years of age. °Every year if you are 40 years old or older. °Diabetes °Have regular diabetes screenings. This checks your fasting blood sugar level. Have the screening done: °Once every three years after age 40 if you are at a normal weight and have a low risk for diabetes. °More often and at a younger age if you are overweight or have a high risk for diabetes. °What should I know about preventing infection? °Hepatitis B °If you have a higher risk for hepatitis B, you should be screened for this virus. Talk with your health care provider to find out if you are at risk for hepatitis B infection. °Hepatitis C °Testing is recommended for: °Everyone born from 1945 through 1965. °Anyone with known risk factors for hepatitis C. °Sexually transmitted infections (STIs) °Get screened for STIs, including gonorrhea and chlamydia, if: °You are sexually active and are younger than 46 years of age. °You are older than 46 years of age and your health care provider tells you that you are at risk for this type of infection. °Your sexual activity has changed since you were last screened, and you are at increased risk for chlamydia or gonorrhea. Ask your health care provider if you are at risk. °Ask your health care provider about whether you are at high risk for HIV. Your health care provider may recommend a prescription medicine to help prevent HIV   infection. If you choose to take medicine to prevent HIV, you should first get tested for HIV. You should then be tested every 3 months for as long as you are taking the medicine. Pregnancy If you are about to stop having your period (premenopausal) and you may become pregnant, seek counseling before you get pregnant. Take 400 to 800  micrograms (mcg) of folic acid every day if you become pregnant. Ask for birth control (contraception) if you want to prevent pregnancy. Osteoporosis and menopause Osteoporosis is a disease in which the bones lose minerals and strength with aging. This can result in bone fractures. If you are 21 years old or older, or if you are at risk for osteoporosis and fractures, ask your health care provider if you should: Be screened for bone loss. Take a calcium or vitamin D supplement to lower your risk of fractures. Be given hormone replacement therapy (HRT) to treat symptoms of menopause. Follow these instructions at home: Alcohol use Do not drink alcohol if: Your health care provider tells you not to drink. You are pregnant, may be pregnant, or are planning to become pregnant. If you drink alcohol: Limit how much you have to: 0-1 drink a day. Know how much alcohol is in your drink. In the [place] one drink equals one 12 oz bottle of beer (355 mL), one 5 oz glass of wine (148 mL), or one 1 oz glass of hard liquor (44 mL). Lifestyle Do not use any products that contain nicotine or tobacco. These products include cigarettes, chewing tobacco, and vaping devices, such as e-cigarettes. If you need help quitting, ask your health care provider. Do not use street drugs. Do not share needles. Ask your health care provider for help if you need support or information about quitting drugs. General instructions Schedule regular health, dental, and eye exams. Stay current with your vaccines. Tell your health care provider if: You often feel depressed. You have ever been abused or do not feel safe at home. Summary Adopting a healthy lifestyle and getting preventive care are important in promoting health and wellness. Follow your health care provider's instructions about healthy diet, exercising, and getting tested or screened for diseases. Follow your health care provider's instructions on monitoring your  cholesterol and blood pressure. This information is not intended to replace advice given to you by your health care provider. Make sure you discuss any questions you have with your health care provider. Document Revised: 11/22/2020 Document Reviewed: 11/22/2020 Elsevier Patient Education  [place] Injection What is this medication? SEMAGLUTIDE (SEM a GLOO tide) treats type 2 diabetes. It works by increasing insulin levels in your body, which decreases your blood sugar (glucose). It also reduces the amount of sugar released into the blood and slows down your digestion. It can also be used to lower the risk of heart attack and stroke in people with type 2 diabetes. Changes to diet and exercise are often combined with this medication. This medicine may be used for other purposes; ask your health care provider or pharmacist if you have questions. COMMON BRAND NAME(S): OZEMPIC What should I tell my care team before I take this medication? They need to know if you have any of these conditions: Endocrine tumors (MEN 2) or if someone in your family had these tumors Eye disease, vision problems History of pancreatitis Kidney disease Stomach problems Thyroid cancer or if someone in your family had thyroid cancer An unusual or allergic reaction to semaglutide, other medications, foods, dyes,  or preservatives Pregnant or trying to get pregnant Breast-feeding How should I use this medication? This medication is for injection under the skin of your upper leg (thigh), stomach area, or upper arm. It is given once every week (every 7 days). You will be taught how to prepare and give this medication. Use exactly as directed. Take your medication at regular intervals. Do not take it more often than directed. If you use this medication with insulin, you should inject this medication and the insulin separately. Do not mix them together. Do not give the injections right next to each other.  Change (rotate) injection sites with each injection. It is important that you put your used needles and syringes in a special sharps container. Do not put them in a trash can. If you do not have a sharps container, call your pharmacist or care team to get one. A special MedGuide will be given to you by the pharmacist with each prescription and refill. Be sure to read this information carefully each time. This medication comes with INSTRUCTIONS FOR USE. Ask your pharmacist for directions on how to use this medication. Read the information carefully. Talk to your pharmacist or care team if you have questions. Talk to your care team about the use of this medication in children. Special care may be needed. Overdosage: If you think you have taken too much of this medicine contact a poison control center or emergency room at once. NOTE: This medicine is only for you. Do not share this medicine with others. What if I miss a dose? If you miss a dose, take it as soon as you can within 5 days after the missed dose. Then take your next dose at your regular weekly time. If it has been longer than 5 days after the missed dose, do not take the missed dose. Take the next dose at your regular time. Do not take double or extra doses. If you have questions about a missed dose, contact your care team for advice. What may interact with this medication? Other medications for diabetes Many medications may cause changes in blood sugar, these include: Alcohol containing beverages Antiviral medications for HIV or AIDS Aspirin and aspirin-like medications Certain medications for blood pressure, heart disease, irregular heart beat Chromium Diuretics Female hormones, such as estrogens or progestins, birth control pills Fenofibrate Gemfibrozil Isoniazid Lanreotide Female hormones or anabolic steroids MAOIs like Carbex, Eldepryl, Marplan, Nardil, and Parnate Medications for weight loss Medications for allergies, asthma,  cold, or cough Medications for depression, anxiety, or psychotic disturbances Niacin Nicotine NSAIDs, medications for pain and inflammation, like ibuprofen or naproxen Octreotide Pasireotide Pentamidine Phenytoin Probenecid Quinolone antibiotics such as ciprofloxacin, levofloxacin, ofloxacin Some herbal dietary supplements Steroid medications such as prednisone or cortisone Sulfamethoxazole; trimethoprim Thyroid hormones Some medications can hide the warning symptoms of low blood sugar (hypoglycemia). You may need to monitor your blood sugar more closely if you are taking one of these medications. These include: Beta-blockers, often used for high blood pressure or heart problems (examples include atenolol, metoprolol, propranolol) Clonidine Guanethidine Reserpine This list may not describe all possible interactions. Give your health care provider a list of all the medicines, herbs, non-prescription drugs, or dietary supplements you use. Also tell them if you smoke, drink alcohol, or use illegal drugs. Some items may interact with your medicine. What should I watch for while using this medication? Visit your care team for regular checks on your progress. Drink plenty of fluids while taking this medication. Check with  your care team if you get an attack of severe diarrhea, nausea, and vomiting. The loss of too much body fluid can make it dangerous for you to take this medication. A test called the HbA1C (A1C) will be monitored. This is a simple blood test. It measures your blood sugar control over the last 2 to 3 months. You will receive this test every 3 to 6 months. Learn how to check your blood sugar. Learn the symptoms of low and high blood sugar and how to manage them. Always carry a quick-source of sugar with you in case you have symptoms of low blood sugar. Examples include hard sugar candy or glucose tablets. Make sure others know that you can choke if you eat or drink when you  develop serious symptoms of low blood sugar, such as seizures or unconsciousness. They must get medical help at once. Tell your care team if you have high blood sugar. You might need to change the dose of your medication. If you are sick or exercising more than usual, you might need to change the dose of your medication. Do not skip meals. Ask your care team if you should avoid alcohol. Many nonprescription cough and cold products contain sugar or alcohol. These can affect blood sugar. Pens should never be shared. Even if the needle is changed, sharing may result in passing of viruses like hepatitis or HIV. Wear a medical ID bracelet or chain, and carry a card that describes your disease and details of your medication and dosage times. Do not become pregnant while taking this medication. Women should inform their care team if they wish to become pregnant or think they might be pregnant. There is a potential for serious side effects to an unborn child. Talk to your care team for more information. What side effects may I notice from receiving this medication? Side effects that you should report to your care team as soon as possible: Allergic reactions--skin rash, itching, hives, swelling of the face, lips, tongue, or throat Change in vision Dehydration--increased thirst, dry mouth, feeling faint or lightheaded, headache, dark yellow or brown urine Gallbladder problems--severe stomach pain, nausea, vomiting, fever Heart palpitations--rapid, pounding, or irregular heartbeat Kidney injury--decrease in the amount of urine, swelling of the ankles, hands, or feet Pancreatitis--severe stomach pain that spreads to your back or gets worse after eating or when touched, fever, nausea, vomiting Thyroid cancer--new mass or lump in the neck, pain or trouble swallowing, trouble breathing, hoarseness Side effects that usually do not require medical attention (report to your care team if they continue or are  bothersome): Diarrhea Loss of appetite Nausea Stomach pain Vomiting This list may not describe all possible side effects. Call your doctor for medical advice about side effects. You may report side effects to FDA at 1-800-FDA-1088. Where should I keep my medication? Keep out of the reach of children. Store unopened pens in a refrigerator between 2 and 8 degrees C (36 and 46 degrees F). Do not freeze. Protect from light and heat. After you first use the pen, it can be stored for 56 days at room temperature between 15 and 30 degrees C (59 and 86 degrees F) or in a refrigerator. Throw away your used pen after 56 days or after the expiration date, whichever comes first. Do not store your pen with the needle attached. If the needle is left on, medication may leak from the pen. NOTE: This sheet is a summary. It may not cover all possible information. If you  have questions about this medicine, talk to your doctor, pharmacist, or health care provider.  2022 Elsevier/[employer] Standard (2020-10-07 00:00:00)

## 2021-06-29 NOTE — Progress Notes (Addendum)
New Patient Office Visit  Subjective:  Patient ID: Brandy Christensen, female    DOB: [birth year]  Age: 45 y.o. MRN: [id number]  CC:  Chief Complaint  Patient presents with   Follow-up    HPI Brandy Christensen presents for follow up has been on wegovy 0.5mg  once daily.  She reports has been doing good on that dose however she does not know to go up on her dose due to shortage from manufacturer.  She denies any low blood glucose sugars she does have a meter to check her blood sugars at home.  Her A1c was in diabetic range we will switch her to Ozempic to see if we can get the 1 mg dose. Denies any history of thyroid disease or thyroid cancer in her family or personal history. Discussed black box warning of medication.  She would like STD screening today for her blood she did have her Pap at last visit 02/08/2021 with Dr. Olivia Mackie.  Pap smear was within normal limits at that time.  STD testing was done by vaginal swab as well and all were negative.  Patient reports she feels well she does want to try to lose weight she is not going to the gym as much since [place] and since illness is increased recently she did recently bought a treadmill and plans to start using that walk with and is watching her diet. Family history of breast cancer in mom and in her maternal grandmother, and negative great gene breast cancer and also patient was tested and has negative history.  Patient  denies any fever, body aches,chills, rash, chest pain, shortness of breath, nausea, vomiting, or diarrhea.    Past Medical History:  Diagnosis Date   ADHD (attention deficit hyperactivity disorder)     Past Surgical History:  Procedure Laterality Date   BREAST BIOPSY Right    needle bx neg    Family History  Problem Relation Age of Onset   Breast cancer Mother 62   Cancer Mother        breast   Learning disabilities Father    Heart disease Father    Hearing loss Maternal Grandmother    Breast cancer Maternal  Grandmother    Hearing loss Maternal Grandfather     Social History   Socioeconomic History   Marital status: Single    Spouse name: Not on file   Number of children: Not on file   Years of education: Not on file   Highest education level: Not on file  Occupational History   Not on file  Tobacco Use   Smoking status: Never   Smokeless tobacco: Never  Substance and Sexual Activity   Alcohol use: Not Currently   Drug use: Not Currently   Sexual activity: Not Currently  Other Topics Concern   Not on file  Social History Narrative   Not on file   Social Determinants of Health   Financial Resource Strain: Not on file  Food Insecurity: Not on file  Transportation Needs: Not on file  Physical Activity: Not on file  Stress: Not on file  Social Connections: Not on file  Intimate Partner Violence: Not on file    ROS Review of Systems  Constitutional:  Positive for fatigue. Negative for activity change, appetite change, chills, diaphoresis, fever and unexpected weight change.  HENT: Negative.    Respiratory: Negative.    Cardiovascular: Negative.   Gastrointestinal: Negative.   Genitourinary: Negative.   Musculoskeletal: Negative.   Skin: Negative.  Neurological: Negative.   Psychiatric/Behavioral: Negative.     Objective:   Today's Vitals: BP 138/88    Pulse 91    Temp (!) 96.7 F (35.9 C)    Ht 5' 3.07" (1.602 m)    Wt (!) 335 lb 6.4 oz (152.1 kg)    SpO2 98%    BMI 59.28 kg/m    Filed Weights   06/29/21 0902  Weight: (!) 335 lb 6.4 oz (152.1 kg)    Last Weight  Most recent update: 06/29/2021  9:03 AM    Weight  152.1 kg (335 lb 6.4 oz)               Physical Exam Vitals reviewed.  Constitutional:      General: She is not in acute distress.    Appearance: She is well-developed. She is obese. She is not ill-appearing, toxic-appearing or diaphoretic.     Interventions: She is not intubated. HENT:     Head: Normocephalic and atraumatic.     Right Ear:  External ear normal.     Left Ear: External ear normal.     Nose: Nose normal.     Mouth/Throat:     Mouth: Mucous membranes are moist.     Pharynx: No oropharyngeal exudate or posterior oropharyngeal erythema.  Eyes:     General: Lids are normal. No scleral icterus.       Right eye: No discharge.        Left eye: No discharge.     Conjunctiva/sclera: Conjunctivae normal.     Right eye: Right conjunctiva is not injected. No exudate or hemorrhage.    Left eye: Left conjunctiva is not injected. No exudate or hemorrhage.    Pupils: Pupils are equal, round, and reactive to light.  Neck:     Thyroid: No thyroid mass or thyromegaly.     Vascular: Normal carotid pulses. No carotid bruit, hepatojugular reflux or JVD.     Trachea: Trachea and phonation normal. No tracheal tenderness or tracheal deviation.     Meningeal: Brudzinski's sign and Kernig's sign absent.  Cardiovascular:     Rate and Rhythm: Normal rate and regular rhythm.     Pulses: Normal pulses.          Radial pulses are 2+ on the right side and 2+ on the left side.       Dorsalis pedis pulses are 2+ on the right side and 2+ on the left side.       Posterior tibial pulses are 2+ on the right side and 2+ on the left side.     Heart sounds: Normal heart sounds, S1 normal and S2 normal. Heart sounds not distant. No murmur heard.   No friction rub. No gallop.  Pulmonary:     Effort: Pulmonary effort is normal. No tachypnea, bradypnea, accessory muscle usage or respiratory distress. She is not intubated.     Breath sounds: Normal breath sounds. No stridor. No wheezing, rhonchi or rales.  Chest:     Chest wall: No tenderness.  Abdominal:     General: Bowel sounds are normal. There is no distension or abdominal bruit.     Palpations: Abdomen is soft. There is no shifting dullness, fluid wave, hepatomegaly, splenomegaly, mass or pulsatile mass.     Tenderness: There is no abdominal tenderness. There is no guarding or rebound.      Hernia: No hernia is present.  Musculoskeletal:        General: No tenderness or deformity.  Normal range of motion.     Cervical back: Full passive range of motion without pain, normal range of motion and neck supple. No edema, erythema or rigidity. No spinous process tenderness or muscular tenderness. Normal range of motion.  Lymphadenopathy:     Head:     Right side of head: No submental, submandibular, tonsillar, preauricular, posterior auricular or occipital adenopathy.     Left side of head: No submental, submandibular, tonsillar, preauricular, posterior auricular or occipital adenopathy.     Cervical: No cervical adenopathy.     Right cervical: No superficial, deep or posterior cervical adenopathy.    Left cervical: No superficial, deep or posterior cervical adenopathy.     Upper Body:     Right upper body: No supraclavicular or pectoral adenopathy.     Left upper body: No supraclavicular or pectoral adenopathy.  Skin:    General: Skin is warm and dry.     Coloration: Skin is not pale.     Findings: No abrasion, bruising, burn, ecchymosis, erythema, lesion, petechiae or rash.     Nails: There is no clubbing.  Neurological:     Mental Status: She is alert and oriented to person, place, and time.     GCS: GCS eye subscore is 4. GCS verbal subscore is 5. GCS motor subscore is 6.     Cranial Nerves: No cranial nerve deficit.     Sensory: No sensory deficit.     Motor: No tremor, atrophy, abnormal muscle tone or seizure activity.     Coordination: Coordination normal.     Gait: Gait normal.     Deep Tendon Reflexes: Reflexes are normal and symmetric. Reflexes normal. Babinski sign absent on the right side. Babinski sign absent on the left side.     Reflex Scores:      Tricep reflexes are 2+ on the right side and 2+ on the left side.      Bicep reflexes are 2+ on the right side and 2+ on the left side.      Brachioradialis reflexes are 2+ on the right side and 2+ on the left side.       Patellar reflexes are 2+ on the right side and 2+ on the left side.      Achilles reflexes are 2+ on the right side and 2+ on the left side. Psychiatric:        Speech: Speech normal.        Behavior: Behavior normal.        Thought Content: Thought content normal.        Judgment: Judgment normal.    Assessment & Plan:   Problem List Items Addressed This Visit       Cardiovascular and Mediastinum   Hypertension associated with diabetes ([facility])   Relevant Medications   EPINEPHrine 0.3 mg/0.3 mL IJ SOAJ injection   Semaglutide, 1 MG/DOSE, 4 MG/3ML SOPN     Other   Attention deficit hyperactivity disorder (ADHD)   Relevant Medications   nortriptyline (PAMELOR) 25 MG capsule   Vitamin D deficiency   Relevant Orders   VITAMIN D 25 Hydroxy (Vit-D Deficiency, Fractures)   History of pancreatitis   Relevant Orders   Amylase   Lipase   Other Visit Diagnoses     Screening for colon cancer    -  Primary   Relevant Orders   Cologuard   Morbid obesity ([facility])       Relevant Medications   Semaglutide, 1 MG/DOSE, 4 MG/3ML  SOPN   Other Relevant Orders   Comprehensive metabolic panel   TSH   CBC with Differential/Platelet   Controlled type 2 diabetes mellitus with complication, without long-term current use of insulin ([facility])       Relevant Medications   Semaglutide, 1 MG/DOSE, 4 MG/3ML SOPN   Other Relevant Orders   Hemoglobin A1c   Screening examination for STD (sexually transmitted disease)       Relevant Orders   RPR   HIV antibody (with reflex)   HSV(herpes simplex vrs) 1+2 ab-IgG   Hepatitis A Ab, Total   Hepatitis, Acute   Hepatitis C Antibody   Hyperlipidemia, unspecified hyperlipidemia type       Relevant Medications   EPINEPHrine 0.3 mg/0.3 mL IJ SOAJ injection   Other Relevant Orders   Lipid panel   Hepatitis B Surface AntiGEN   Hepatitis B Core Antibody, total       Outpatient Encounter Medications as of 06/29/2021  Medication Sig   albuterol (VENTOLIN  HFA) 108 (90 Base) MCG/ACT inhaler Inhale 2 puffs into the lungs every 4 (four) hours as needed.   EPINEPHrine 0.3 mg/0.3 mL IJ SOAJ injection INJECT INTO OUTER THIGH FOR SEVERE ALLERGIC REACTION. CALL 911 AFTER USE. MAY SUBSTITUTE ANY BRAND   Semaglutide, 1 MG/DOSE, 4 MG/3ML SOPN Inject 1 mg as directed once a week.   SYMBICORT 160-4.5 MCG/ACT inhaler SMARTSIG:2 Puff(s) By Mouth Twice Daily   [DISCONTINUED] nortriptyline (PAMELOR) 25 MG capsule SMARTSIG:1 Capsule(s) By Mouth Every Evening   [DISCONTINUED] Semaglutide,0.25 or 0.5MG /DOS, (OZEMPIC, 0.25 OR 0.5 MG/DOSE,) 2 MG/1.5ML SOPN INJECT 0.25 WEEKLY FOR 1 MONTH THEN INCREASE TO 0.5 ONCE WEEKLY FOR 1 MONTH THEN WEGOVY   nortriptyline (PAMELOR) 25 MG capsule SMARTSIG:1 Capsule(s) By Mouth Every Evening   [DISCONTINUED] atomoxetine (STRATTERA) 80 MG capsule Take 80 mg by mouth daily. (Patient not taking: Reported on 01/19/2021)   [DISCONTINUED] Semaglutide-Weight Management (WEGOVY) 1 MG/0.5ML SOAJ Inject 1 mg into the skin once a week. Week 9-12 (Patient not taking: Reported on 06/29/2021)   [DISCONTINUED] Semaglutide-Weight Management (WEGOVY) 1.7 MG/0.75ML SOAJ Inject 1.7 mg into the skin once a week. Week 13-16 (Patient not taking: Reported on 06/29/2021)   [DISCONTINUED] Semaglutide-Weight Management (WEGOVY) 2.4 MG/0.75ML SOAJ Inject 2.4 mg into the skin once a week. 17 and beyond (Patient not taking: Reported on 06/29/2021)   No facility-administered encounter medications on file as of 06/29/2021.   Meds ordered this encounter  Medications   Semaglutide, 1 MG/DOSE, 4 MG/3ML SOPN    Sig: Inject 1 mg as directed once a week.    Dispense:  3 mL    Refill:  2   nortriptyline (PAMELOR) 25 MG capsule    Sig: SMARTSIG:1 Capsule(s) By Mouth Every Evening    Dispense:  30 capsule    Refill:  1   ADHD and diabetes medication refilled as above.    Discussed known black box warning for anti depression/ anxiety medication. Need to report any  behavioral changes right, if any homicidal or suicidal thoughts or ideas seek medical attention right away. Call 911.   Follow up on diabetes and weight at next visit in 3 months.   The patient is advised to  begin progressive daily aerobic exercise program, follow a low fat, low cholesterol diet, attempt to lose weight, reduce salt in diet and cooking, improve dietary compliance, use calcium 1 gram daily with Vit D, continue current medications, continue current healthy lifestyle patterns, and return for routine annual checkups. Red Flags  discussed. The patient was given clear instructions to go to ER or return to medical center if any red flags develop, symptoms do not improve, worsen or new problems develop. They verbalized understanding.    Follow-up: Return in about 3 months (around 09/27/2021), or if symptoms worsen or fail to improve, for at any time for any worsening symptoms, Go to Emergency room/ urgent care if worse.   Marcille Buffy, FNP

## 2021-06-30 LAB — HSV(HERPES SIMPLEX VRS) I + II AB-IGG
HAV 1 IGG,TYPE SPECIFIC AB: <0.9 index
HSV 2 IGG,TYPE SPECIFIC AB: <0.9 index

## 2021-06-30 LAB — HEPATITIS PANEL, ACUTE
Hep A IgM: NONREACTIVE
Hep B C IgM: NONREACTIVE
Hepatitis B Surface Ag: NONREACTIVE
Hepatitis C Ab: NONREACTIVE
SIGNAL TO CUT-OFF: <0.02 (ref <1.00)

## 2021-06-30 LAB — RPR: RPR Ser Ql: NONREACTIVE

## 2021-06-30 LAB — HIV ANTIBODY (ROUTINE TESTING W REFLEX): HIV 1&2 Ab, 4th Generation: NONREACTIVE

## 2021-06-30 LAB — HEPATITIS B CORE ANTIBODY, TOTAL: Hep B Core Total Ab: NONREACTIVE

## 2021-06-30 LAB — HEPATITIS A ANTIBODY, TOTAL: Hepatitis A AB,Total: NONREACTIVE

## 2021-07-03 ENCOUNTER — Other Ambulatory Visit: Payer: Self-pay | Admitting: Adult Health

## 2021-07-03 DIAGNOSIS — R748 Abnormal levels of other serum enzymes: Secondary | ICD-10-CM

## 2021-07-03 NOTE — Progress Notes (Signed)
RPR negative for syphilis.  HIV negative/ non reactive.  Herpes I and 2 type antibodies negative for past infection.  Hepatitis A non reactive. Acute hepatitis panel is non reactive/ negative.  Hep B non reactive.  Amylase within normal limits.  Lipase is elevated, any alcohol use  ? Stomach pain ? NSAIDs ?Referral to gastroenterology advised and placed she should hear within 1- 2 weeks. Advise to hold Ozempic until evaluated. Monitor glucose and resturn to office for any worsening symptoms at anytime  CBC okay. CMP shows elevated glucose.  LDL elevated.  Discuss lifestyle modification with patient e.g. increase exercise, fiber, fruits, vegetables, lean meat, and omega 3/fish intake and decrease saturated fat.  If patient following strict diet and exercise program already please schedule follow up appointment with primary care physician

## 2021-07-03 NOTE — Progress Notes (Signed)
Orders Placed This Encounter  Procedures   Ambulatory referral to Gastroenterology    Referral Priority:   Urgent    Referral Type:   Consultation    Referral Reason:   Specialty Services Required    Number of Visits Requested:   1   Elevated lipase - Plan: Ambulatory referral to Gastroenterology

## 2021-07-20 LAB — COLOGUARD

## 2021-07-20 NOTE — Progress Notes (Signed)
Sample for Cologuard could not be processed it says, will need to reorder if patient in agreement.

## 2021-07-21 ENCOUNTER — Other Ambulatory Visit: Payer: Self-pay | Admitting: Adult Health

## 2021-07-21 DIAGNOSIS — F909 Attention-deficit hyperactivity disorder, unspecified type: Secondary | ICD-10-CM

## 2021-07-27 ENCOUNTER — Telehealth: Payer: Self-pay | Admitting: Pharmacist

## 2021-07-27 NOTE — Telephone Encounter (Signed)
Received PA request for Ozempic. Completed (Key [id number]).

## 2021-07-27 NOTE — Telephone Encounter (Addendum)
PA approved. 07/27/21-07/26/2024. Notifying patient via MyChart message. Called pharmacy to ask to fill.

## 2021-08-19 ENCOUNTER — Other Ambulatory Visit: Payer: Self-pay | Admitting: Adult Health

## 2021-08-19 DIAGNOSIS — F909 Attention-deficit hyperactivity disorder, unspecified type: Secondary | ICD-10-CM

## 2021-08-23 ENCOUNTER — Other Ambulatory Visit: Payer: Self-pay | Admitting: Adult Health

## 2021-08-23 DIAGNOSIS — F909 Attention-deficit hyperactivity disorder, unspecified type: Secondary | ICD-10-CM

## 2021-08-24 ENCOUNTER — Encounter: Payer: Self-pay | Admitting: Gastroenterology

## 2021-08-24 ENCOUNTER — Ambulatory Visit (INDEPENDENT_AMBULATORY_CARE_PROVIDER_SITE_OTHER): Payer: Managed Care, Other (non HMO) | Admitting: Gastroenterology

## 2021-08-24 ENCOUNTER — Telehealth: Payer: Self-pay

## 2021-08-24 ENCOUNTER — Other Ambulatory Visit: Payer: Self-pay

## 2021-08-24 VITALS — BP 180/83 | HR 81 | Temp 98.6°F | Ht 63.0 in | Wt 343.0 lb

## 2021-08-24 DIAGNOSIS — K859 Acute pancreatitis without necrosis or infection, unspecified: Secondary | ICD-10-CM

## 2021-08-24 DIAGNOSIS — Z1211 Encounter for screening for malignant neoplasm of colon: Secondary | ICD-10-CM

## 2021-08-24 DIAGNOSIS — R748 Abnormal levels of other serum enzymes: Secondary | ICD-10-CM | POA: Diagnosis not present

## 2021-08-24 MED ORDER — NA SULFATE-K SULFATE-MG SULF 17.5-3.13-1.6 GM/177ML PO SOLN: 1.0000 | Freq: Once | ORAL | 0 refills | Status: AC

## 2021-08-24 NOTE — Patient Instructions (Signed)
I will call you this afternoon with the MRI appointment.

## 2021-08-24 NOTE — Progress Notes (Signed)
Gastroenterology Consultation  Referring Provider:     Sharmon Leyden* Primary Care Physician:  Doreen Beam, FNP Primary Gastroenterologist:  Dr. Allen Norris     Reason for Consultation:     Increased Lipase        HPI:   Brandy Christensen is a 46 y.o. y/o female referred for consultation & management of Increased Lipase by Dr. Claudina Lick, Kelby Aline, FNP.  This patient comes in today with a history of having pancreatitis recently with a lipase of 184.  She states that it felt like the last time she had pancreatitis.  Her lipase levels last month were:  Component     Latest Ref Rng & Units 12/23/2020 06/29/2021  Lipase     11.0 - 59.0 U/L 42.0 184.0 (H)   She reports pancreatitis back in 2020. She said there was no cause found at that time. She now says Ozempic may have caused it. She reports that the pain increased when she took the medication. She also has been feeling well on a liquid diet. She has never had her gall bladder out. She says her gall bladder has been evaluated and has been normal.  There is no report of any alcohol abuse.  She also denies any other medications.  The patient states that her pain was pretty constant for the last few weeks but listed above got better when she went on a bland liquid diet.  The patient also reports that she gained weight while taking Ozempic and is happy to come off of it because she really believes that it induced this case of pancreatitis.  She does report having a ultrasound of the pancreas in the past but no other imaging.  She has had no further labs since the lipase was elevated at 184.  Past Medical History:  Diagnosis Date   ADHD (attention deficit hyperactivity disorder)     Past Surgical History:  Procedure Laterality Date   BREAST BIOPSY Right    needle bx neg    Prior to Admission medications   Medication Sig Start Date End Date Taking? Authorizing Provider  albuterol (VENTOLIN HFA) 108 (90 Base) MCG/ACT inhaler Inhale 2  puffs into the lungs every 4 (four) hours as needed. 12/01/20  Yes [provider]  EPINEPHrine 0.3 mg/0.3 mL IJ SOAJ injection INJECT INTO OUTER THIGH FOR SEVERE ALLERGIC REACTION. CALL 911 AFTER USE. MAY SUBSTITUTE ANY BRAND 06/23/19  Yes [provider]  Multiple Vitamin (MULTIVITAMIN) tablet Take 1 tablet by mouth daily.   Yes [provider]  nortriptyline (PAMELOR) 25 MG capsule SMARTSIG:1 Capsule(s) By Mouth Every Evening 06/29/21  Yes Flinchum, Kelby Aline, FNP  SYMBICORT 160-4.5 MCG/ACT inhaler SMARTSIG:2 Puff(s) By Mouth Twice Daily 12/01/20  Yes [provider]    Family History  Problem Relation Age of Onset   Breast cancer Mother 56   Cancer Mother        breast   Learning disabilities Father    Heart disease Father    Hearing loss Maternal Grandmother    Breast cancer Maternal Grandmother    Hearing loss Maternal Grandfather      Social History   Tobacco Use   Smoking status: Never   Smokeless tobacco: Never  Substance Use Topics   Alcohol use: Not Currently   Drug use: Not Currently    Allergies as of 08/24/2021 - Review Complete 08/24/2021  Allergen Reaction Noted   Fluticasone Anaphylaxis 01/19/2021   Levofloxacin Anaphylaxis 01/19/2021   Losartan  Other (See Comments) 01/19/2021   Metformin and related  06/29/2021   Prednisone Other (See Comments) 12/23/2020    Review of Systems:    All systems reviewed and negative except where noted in HPI.   Physical Exam:  BP (!) 180/83    Pulse 81    Temp 98.6 F (37 C) (Oral)    Ht 5\' 3"  (1.6 m)    Wt (!) 343 lb (155.6 kg)    BMI 60.76 kg/m  No LMP recorded. General:   Alert,  Well-developed, obese, pleasant and cooperative in NAD Head:  Normocephalic and atraumatic. Eyes:  Sclera clear, no icterus.   Conjunctiva pink. Ears:  Normal auditory acuity. Neck:  Supple; no masses or thyromegaly. Lungs:  Respirations even and unlabored.  Clear throughout to auscultation.   No wheezes,  crackles, or rhonchi. No acute distress. Heart:  Regular rate and rhythm; no murmurs, clicks, rubs, or gallops. Abdomen:  Normal bowel sounds.  No bruits.  Soft, non-tender and non-distended without masses, hepatosplenomegaly or hernias noted.  No guarding or rebound tenderness.  Negative Carnett sign.   Rectal:  Deferred.  Pulses:  Normal pulses noted. Extremities:  No clubbing or edema.  No cyanosis. Neurologic:  Alert and oriented x3;  grossly normal neurologically. Skin:  Intact without significant lesions or rashes.  No jaundice. Lymph Nodes:  No significant cervical adenopathy. Psych:  Alert and cooperative. Normal mood and affect.  Imaging Studies: No results found.  Assessment and Plan:   Brandy Christensen is a 46 y.o. y/o female who comes in today with history of pancreatitis in 2020 with a recurrent bout in December.  The lipase was elevated and she had characteristic pain.  The patient had sustained pain for the last few weeks but went on a liquid diet and reports that her pain is better.  She attributes the attack to Ozempic.  She now has stopped that and is feeling better.  The patient reports that she is in need of a screening colonoscopy also.  The patient will be set up for screening colonoscopy and will have her lab sent off for an IgG4 for autoimmune pancreatitis, repeat lipase, and a MRI/MRCP of the pancreas to rule out any pancreatic divisum or any abnormality of the pancreas that may be causing her symptoms.  The patient has been explained the plan agrees with it.    Lucilla Lame, MD. Mar[facility] Regal    Note: This dictation was prepared with Dragon dictation along with smaller phrase technology. Any transcriptional errors that result from this process are unintentional.

## 2021-08-24 NOTE — Telephone Encounter (Signed)
Called pt and she is aware of MRI scheduled for 09/03/2021 [place] arrive at 8:30am and NPO 4 hours prior

## 2021-08-26 LAB — IGG 4: IgG, Subclass 4: 65 mg/dL (ref 2–96)

## 2021-08-26 LAB — LIPASE: Lipase: 97 U/L — ABNORMAL HIGH (ref 14–72)

## 2021-08-31 NOTE — Addendum Note (Signed)
Addended by: Lurlean Nanny on: 08/31/2021 10:51 AM   Modules accepted: Orders

## 2021-09-01 ENCOUNTER — Ambulatory Visit: Payer: Managed Care, Other (non HMO) | Admitting: Gastroenterology

## 2021-09-03 ENCOUNTER — Ambulatory Visit: Admission: RE | Admit: 2021-09-03 | Payer: Managed Care, Other (non HMO) | Source: Ambulatory Visit

## 2021-09-06 ENCOUNTER — Other Ambulatory Visit: Payer: Self-pay | Admitting: Gastroenterology

## 2021-09-06 ENCOUNTER — Telehealth: Payer: Self-pay

## 2021-09-06 ENCOUNTER — Ambulatory Visit
Admission: RE | Admit: 2021-09-06 | Discharge: 2021-09-06 | Disposition: A | Discharge status: Home / Self Care | Payer: Managed Care, Other (non HMO) | Source: Ambulatory Visit | Attending: Gastroenterology | Admitting: Gastroenterology

## 2021-09-06 ENCOUNTER — Other Ambulatory Visit: Payer: Self-pay

## 2021-09-06 DIAGNOSIS — K858 Other acute pancreatitis without necrosis or infection: Secondary | ICD-10-CM

## 2021-09-06 DIAGNOSIS — K859 Acute pancreatitis without necrosis or infection, unspecified: Secondary | ICD-10-CM

## 2021-09-06 NOTE — Telephone Encounter (Signed)
Patient went in for MRI today and had a panic attack. She wants to know if something could be prescribed to her before the MRI so she able to rescheduled the MRI

## 2021-09-13 NOTE — Telephone Encounter (Signed)
Pt states that her previous MRI was just of her head, so she had never felt so anxious and uneasy before.  Did you want to send in the usual Valium?

## 2021-09-14 MED ORDER — DIAZEPAM 5 MG PO TABS: ORAL_TABLET | ORAL | 0 refills | Status: DC

## 2021-09-14 NOTE — Telephone Encounter (Signed)
Rx called in to pharmacy. 

## 2021-09-14 NOTE — Addendum Note (Signed)
Addended by: Lurlean Nanny on: 09/14/2021 03:48 PM ? ? Modules accepted: Orders ? ?

## 2021-09-27 ENCOUNTER — Encounter: Payer: Self-pay | Admitting: Adult Health

## 2021-09-27 ENCOUNTER — Other Ambulatory Visit: Payer: Self-pay

## 2021-09-27 ENCOUNTER — Ambulatory Visit (INDEPENDENT_AMBULATORY_CARE_PROVIDER_SITE_OTHER): Payer: Managed Care, Other (non HMO) | Admitting: Adult Health

## 2021-09-27 VITALS — BP 138/80 | HR 63 | Temp 98.0°F | Ht 63.0 in | Wt 343.0 lb

## 2021-09-27 DIAGNOSIS — E119 Type 2 diabetes mellitus without complications: Secondary | ICD-10-CM | POA: Diagnosis not present

## 2021-09-27 DIAGNOSIS — Z6841 Body Mass Index (BMI) 40.0 and over, adult: Secondary | ICD-10-CM

## 2021-09-27 DIAGNOSIS — E785 Hyperlipidemia, unspecified: Secondary | ICD-10-CM

## 2021-09-27 DIAGNOSIS — Z8719 Personal history of other diseases of the digestive system: Secondary | ICD-10-CM | POA: Diagnosis not present

## 2021-09-27 DIAGNOSIS — J302 Other seasonal allergic rhinitis: Secondary | ICD-10-CM

## 2021-09-27 DIAGNOSIS — F909 Attention-deficit hyperactivity disorder, unspecified type: Secondary | ICD-10-CM

## 2021-09-27 MED ORDER — LEVOCETIRIZINE DIHYDROCHLORIDE 5 MG PO TABS: 5.0000 mg | ORAL_TABLET | Freq: Every evening | ORAL | 1 refills | Status: AC

## 2021-09-27 MED ORDER — NORTRIPTYLINE HCL 25 MG PO CAPS: ORAL_CAPSULE | ORAL | 1 refills | Status: DC

## 2021-09-27 MED ORDER — METFORMIN HCL ER 500 MG PO TB24: 500.0000 mg | ORAL_TABLET | Freq: Every day | ORAL | 1 refills | Status: DC

## 2021-09-27 NOTE — Progress Notes (Addendum)
? ?Established Patient Office Visit ? ?Subjective:  ?Patient ID: Brandy Christensen, female    DOB: 01-Dec-1975  Age: 46 y.o. MRN: [id number] ? ?CC:  ?Chief Complaint  ?Patient presents with  ? Follow-up  ?  Pt reports adverse reaction to Ozempic. Pt states after starting '1mg'$  of ozempic, started having pain in abdomen. Pt saw gastro, was dx with pancreatitis. Also dx with Elevated enzymes, and elevated bp that resolved after having pancreatitis and stopping ozempic . Patient stopped ozempic at last visit as per provider instructions. Would like to talk about other options for diabetes control/weight loss.  ? ? ?HPI ?Brandy Christensen presents for follow up.  ? ?At her last visit she had lipase checked to be elevated. She was advised to hold her lipase and was advised to stop ozempic and referred to Dr. Allen Norris in GI. Diagnosed with pancreatitis. She is advised she should avoid all GLP medications in the future.  ?Blood pressure was elevated at Dr. Allen Norris office today is 138/80, she prefers to monitor at home and return if consistently over 130/ 80 - working on diet and exercise.  ? ? ?She asks to try Metformin XR she reports she has never tried it before  she reports that she has never tried XR metformin only regular release.  ? ?She has colonoscopy scheduled in 2 days with Dr. Allen Norris.  ? ?Denies any abdominal pain, back pain.  ?Patient  denies any fever, body aches,chills, rash, chest pain, shortness of breath, nausea, vomiting, or diarrhea.  ?Denies dizziness, lightheadedness, pre syncopal or syncopal episodes.  ? ? ?Past Medical History:  ?Diagnosis Date  ? ADHD (attention deficit hyperactivity disorder)   ? ? ?Past Surgical History:  ?Procedure Laterality Date  ? BREAST BIOPSY Right   ? needle bx neg  ? ? ?Family History  ?Problem Relation Age of Onset  ? Breast cancer Mother 65  ? Cancer Mother   ?     breast  ? Learning disabilities Father   ? Heart disease Father   ? Hearing loss Maternal Grandmother   ? Breast cancer Maternal  Grandmother   ? Hearing loss Maternal Grandfather   ? ? ?Social History  ? ?Socioeconomic History  ? Marital status: Single  ?  Spouse name: Not on file  ? Number of children: Not on file  ? Years of education: Not on file  ? Highest education level: Not on file  ?Occupational History  ? Not on file  ?Tobacco Use  ? Smoking status: Never  ? Smokeless tobacco: Never  ?Substance and Sexual Activity  ? Alcohol use: Not Currently  ? Drug use: Not Currently  ? Sexual activity: Not Currently  ?Other Topics Concern  ? Not on file  ?Social History Narrative  ? Not on file  ? ?Social Determinants of Health  ? ?Financial Resource Strain: Not on file  ?Food Insecurity: Not on file  ?Transportation Needs: Not on file  ?Physical Activity: Not on file  ?Stress: Not on file  ?Social Connections: Not on file  ?Intimate Partner Violence: Not on file  ? ? ?Outpatient Medications Prior to Visit  ?Medication Sig Dispense Refill  ? albuterol (VENTOLIN HFA) 108 (90 Base) MCG/ACT inhaler Inhale 2 puffs into the lungs every 4 (four) hours as needed.    ? diazepam (VALIUM) 5 MG tablet Take 1 tablet 1 hour prior to MRI, DO NOT DRIVE 1 tablet 0  ? EPINEPHrine 0.3 mg/0.3 mL IJ SOAJ injection INJECT INTO OUTER THIGH FOR SEVERE  ALLERGIC REACTION. CALL 911 AFTER USE. MAY SUBSTITUTE ANY BRAND    ? Multiple Vitamin (MULTIVITAMIN) tablet Take 1 tablet by mouth daily.    ? SYMBICORT 160-4.5 MCG/ACT inhaler SMARTSIG:2 Puff(s) By Mouth Twice Daily    ? nortriptyline (PAMELOR) 25 MG capsule SMARTSIG:1 Capsule(s) By Mouth Every Evening 30 capsule 1  ? ?No facility-administered medications prior to visit.  ? ? ?Allergies  ?Allergen Reactions  ? Fluticasone Anaphylaxis  ?  Throat closing, unable to breathe   ? Levofloxacin Anaphylaxis  ?  Throat closing, unable to breathe   ? Ozempic (0.25 Or 0.5 Mg-Dose) [Semaglutide(0.25 Or 0.'5mg'$ -Dos)] Other (See Comments)  ?  Severe pancreatitis   ? Losartan Other (See Comments)  ?  Low diastolic, fainting   ? Metformin  And Related   ?  Metformin regular - GI upset. - mild to moderate.   ? Prednisone Other (See Comments)  ?  Paranoia. Also allergic to most steroids   ? ? ?ROS ?Review of Systems  ?Constitutional: Negative.   ?HENT: Negative.    ?Eyes: Negative.   ?Respiratory: Negative.    ?Cardiovascular: Negative.   ?Gastrointestinal: Negative.   ?Endocrine: Negative.   ?Genitourinary: Negative.   ?Musculoskeletal: Negative.   ?Skin: Negative.   ?Allergic/Immunologic: Positive for environmental allergies (seasonal is back on xyzal.). Negative for food allergies and immunocompromised state.  ?Neurological: Negative.   ?Psychiatric/Behavioral: Negative.    ? ?  ?Objective:  ?  ?Physical Exam ? ?BP 138/80 (BP Location: Left Arm, Patient Position: Sitting, Cuff Size: Large)   Pulse 63   Temp 98 ?F (36.7 ?C) (Oral)   Ht '5\' 3"'$  (1.6 m)   Wt (!) 343 lb (155.6 kg)   SpO2 97%   BMI 60.76 kg/m?  ?Wt Readings from Last 3 Encounters:  ?09/27/21 (!) 343 lb (155.6 kg)  ?08/24/21 (!) 343 lb (155.6 kg)  ?06/29/21 (!) 335 lb 6.4 oz (152.1 kg)  ? ? ?General: Appearance:    Severely obese female in no acute distress  ?Eyes:    PERRL, conjunctiva/corneas clear, EOM's intact       ?Lungs:     Clear to auscultation bilaterally, respirations unlabored  ?Heart:    Normal heart rate. Normal rhythm. No murmurs, rubs, or gallops.  ?  ?MS:   All extremities are intact.  ?  ?Neurologic:   Awake, alert, oriented x 3. No apparent focal neurological           defect.   ?  ?Abdomen: soft and non-tender without masses, organomegaly or hernias noted.  No guarding or rebound  ? ?Health Maintenance Due  ?Topic Date Due  ? FOOT EXAM  Never done  ? OPHTHALMOLOGY EXAM  Never done  ? COLONOSCOPY (Pts 45-60yr Insurance coverage will need to be confirmed)  Never done  ? ? ?There are no preventive care reminders to display for this patient. ? ?Lab Results  ?Component Value Date  ? TSH 2.00 06/29/2021  ? ?Lab Results  ?Component Value Date  ? WBC 9.6 06/29/2021  ? HGB  13.3 06/29/2021  ? HCT 41.3 06/29/2021  ? MCV 83.5 06/29/2021  ? PLT 295.0 06/29/2021  ? ?Lab Results  ?Component Value Date  ? NA 136 06/29/2021  ? K 4.1 06/29/2021  ? CO2 27 06/29/2021  ? GLUCOSE 117 (H) 06/29/2021  ? BUN 9 06/29/2021  ? CREATININE 0.66 06/29/2021  ? BILITOT 0.4 06/29/2021  ? ALKPHOS 61 06/29/2021  ? AST 13 06/29/2021  ? ALT 23 06/29/2021  ?  PROT 7.1 06/29/2021  ? ALBUMIN 4.1 06/29/2021  ? CALCIUM 8.9 06/29/2021  ? GFR 105.19 06/29/2021  ? ?Lab Results  ?Component Value Date  ? CHOL 190 06/29/2021  ? ?Lab Results  ?Component Value Date  ? HDL 40.70 06/29/2021  ? ?Lab Results  ?Component Value Date  ? LDLCALC 122 (H) 06/29/2021  ? ?Lab Results  ?Component Value Date  ? TRIG 136.0 06/29/2021  ? ?Lab Results  ?Component Value Date  ? CHOLHDL 5 06/29/2021  ? ?Lab Results  ?Component Value Date  ? HGBA1C 6.5 06/29/2021  ? ? ?  ?Assessment & Plan:  ? ?Problem List Items Addressed This Visit   ? ?  ? Endocrine  ? Diabetes mellitus without complication ([place])  ? Relevant Medications  ? metFORMIN (GLUCOPHAGE-XR) 500 MG 24 hr tablet  ? Other Relevant Orders  ? Hemoglobin A1c  ? CBC with Differential/Platelet  ? Comprehensive metabolic panel  ?  ? Other  ? Attention deficit hyperactivity disorder (ADHD)  ? Relevant Medications  ? nortriptyline (PAMELOR) 25 MG capsule  ? History of chronic pancreatitis  ? Hyperlipidemia  ? Relevant Orders  ? Direct LDL  ? Morbid obesity with BMI of 60.0-69.9, adult ([place]) - Primary  ? Relevant Medications  ? metFORMIN (GLUCOPHAGE-XR) 500 MG 24 hr tablet  ? Seasonal allergies  ? ? ?Meds ordered this encounter  ?Medications  ? metFORMIN (GLUCOPHAGE-XR) 500 MG 24 hr tablet  ?  Sig: Take 1 tablet (500 mg total) by mouth daily with breakfast.  ?  Dispense:  30 tablet  ?  Refill:  1  ? nortriptyline (PAMELOR) 25 MG capsule  ?  Sig: SMARTSIG:1 Capsule(s) By Mouth Every Evening  ?  Dispense:  30 capsule  ?  Refill:  1  ? levocetirizine (XYZAL) 5 MG tablet  ?  Sig: Take 1 tablet (5 mg  total) by mouth every evening.  ?  Dispense:  90 tablet  ?  Refill:  1  ? ?Orders Placed This Encounter  ?Procedures  ? Hemoglobin A1c  ? CBC with Differential/Platelet  ? Comprehensive metabolic panel  ? L

## 2021-09-27 NOTE — Patient Instructions (Signed)
Diabetes Mellitus and Nutrition, Adult ?When you have diabetes, or diabetes mellitus, it is very important to have healthy eating habits because your blood sugar (glucose) levels are greatly affected by what you eat and drink. Eating healthy foods in the right amounts, at about the same times every day, can help you: ?Manage your blood glucose. ?Lower your risk of heart disease. ?Improve your blood pressure. ?Reach or maintain a healthy weight. ?What can affect my meal plan? ?Every person with diabetes is different, and each person has different needs for a meal plan. Your health care provider may recommend that you work with a dietitian to make a meal plan that is best for you. Your meal plan may vary depending on factors such as: ?The calories you need. ?The medicines you take. ?Your weight. ?Your blood glucose, blood pressure, and cholesterol levels. ?Your activity level. ?Other health conditions you have, such as heart or kidney disease. ?How do carbohydrates affect me? ?Carbohydrates, also called carbs, affect your blood glucose level more than any other type of food. Eating carbs raises the amount of glucose in your blood. ?It is important to know how many carbs you can safely have in each meal. This is different for every person. Your dietitian can help you calculate how many carbs you should have at each meal and for each snack. ?How does alcohol affect me? ?Alcohol can cause a decrease in blood glucose (hypoglycemia), especially if you use insulin or take certain diabetes medicines by mouth. Hypoglycemia can be a life-threatening condition. Symptoms of hypoglycemia, such as sleepiness, dizziness, and confusion, are similar to symptoms of having too much alcohol. ?Do not drink alcohol if: ?Your health care provider tells you not to drink. ?You are pregnant, may be pregnant, or are planning to become pregnant. ?If you drink alcohol: ?Limit how much you have to: ?0-1 drink a day for women. ?0-2 drinks a day  for men. ?Know how much alcohol is in your drink. In the [place] one drink equals one 12 oz bottle of beer (355 mL), one 5 oz glass of wine (148 mL), or one 1? oz glass of hard liquor (44 mL). ?Keep yourself hydrated with water, diet soda, or unsweetened iced tea. Keep in mind that regular soda, juice, and other mixers may contain a lot of sugar and must be counted as carbs. ?What are tips for following this plan? ?Reading food labels ?Start by checking the serving size on the Nutrition Facts label of packaged foods and drinks. The number of calories and the amount of carbs, fats, and other nutrients listed on the label are based on one serving of the item. Many items contain more than one serving per package. ?Check the total grams (g) of carbs in one serving. ?Check the number of grams of saturated fats and trans fats in one serving. Choose foods that have a low amount or none of these fats. ?Check the number of milligrams (mg) of salt (sodium) in one serving. Most people should limit total sodium intake to less than 2,300 mg per day. ?Always check the nutrition information of foods labeled as "low-fat" or "nonfat." These foods may be higher in added sugar or refined carbs and should be avoided. ?Talk to your dietitian to identify your daily goals for nutrients listed on the label. ?Shopping ?Avoid buying canned, pre-made, or processed foods. These foods tend to be high in fat, sodium, and added sugar. ?Shop around the outside edge of the grocery store. This is where you will   most often find fresh fruits and vegetables, bulk grains, fresh meats, and fresh dairy products. ?Cooking ?Use low-heat cooking methods, such as baking, instead of high-heat cooking methods, such as deep frying. ?Cook using healthy oils, such as olive, canola, or sunflower oil. ?Avoid cooking with butter, cream, or high-fat meats. ?Meal planning ?Eat meals and snacks regularly, preferably at the same times every day. Avoid going long periods of  time without eating. ?Eat foods that are high in fiber, such as fresh fruits, vegetables, beans, and whole grains. ?Eat 4-6 oz (112-168 g) of lean protein each day, such as lean meat, chicken, fish, eggs, or tofu. One ounce (oz) (28 g) of lean protein is equal to: ?1 oz (28 g) of meat, chicken, or fish. ?1 egg. ?? cup (62 g) of tofu. ?Eat some foods each day that contain healthy fats, such as avocado, nuts, seeds, and fish. ?What foods should I eat? ?Fruits ?Berries. Apples. Oranges. Peaches. Apricots. Plums. Grapes. Mangoes. Papayas. Pomegranates. Kiwi. Cherries. ?Vegetables ?Leafy greens, including lettuce, spinach, kale, chard, collard greens, mustard greens, and cabbage. Beets. Cauliflower. Broccoli. Carrots. Green beans. Tomatoes. Peppers. Onions. Cucumbers. Brussels sprouts. ?Grains ?Whole grains, such as whole-wheat or whole-grain bread, crackers, tortillas, cereal, and pasta. Unsweetened oatmeal. Quinoa. Brown or wild rice. ?Meats and other proteins ?Seafood. Poultry without skin. Lean cuts of poultry and beef. Tofu. Nuts. Seeds. ?Dairy ?Low-fat or fat-free dairy products such as milk, yogurt, and cheese. ?The items listed above may not be a complete list of foods and beverages you can eat and drink. Contact a dietitian for more information. ?What foods should I avoid? ?Fruits ?Fruits canned with syrup. ?Vegetables ?Canned vegetables. Frozen vegetables with butter or cream sauce. ?Grains ?Refined white flour and flour products such as bread, pasta, snack foods, and cereals. Avoid all processed foods. ?Meats and other proteins ?Fatty cuts of meat. Poultry with skin. Breaded or fried meats. Processed meat. Avoid saturated fats. ?Dairy ?Full-fat yogurt, cheese, or milk. ?Beverages ?Sweetened drinks, such as soda or iced tea. ?The items listed above may not be a complete list of foods and beverages you should avoid. Contact a dietitian for more information. ?Questions to ask a health care provider ?Do I need to  meet with a certified diabetes care and education specialist? ?Do I need to meet with a dietitian? ?What number can I call if I have questions? ?When are the best times to check my blood glucose? ?Where to find more information: ?American Diabetes Association: diabetes.org ?Academy of Nutrition and Dietetics: eatright.org ?[employer] of Diabetes and Digestive and Kidney Diseases: AmenCredit.is ?Association of Diabetes Care & Education Specialists: diabeteseducator.org ?Summary ?It is important to have healthy eating habits because your blood sugar (glucose) levels are greatly affected by what you eat and drink. It is important to use alcohol carefully. ?A healthy meal plan will help you manage your blood glucose and lower your risk of heart disease. ?Your health care provider may recommend that you work with a dietitian to make a meal plan that is best for you. ?This information is not intended to replace advice given to you by your health care provider. Make sure you discuss any questions you have with your health care provider. ?Document Revised: 02/04/2020 Document Reviewed: 02/04/2020 ?Elsevier Patient Education ? [place]. ?Metformin Extended-Release Tablets ?What is this medication? ?METFORMIN (met FOR min) treats type 2 diabetes. It controls blood sugar (glucose) and helps your body use insulin effectively. This medication is often combined with changes to diet and  exercise. ?This medicine may be used for other purposes; ask your health care provider or pharmacist if you have questions. ?COMMON BRAND NAME(S): Fortamet, Glucophage XR, Glumetza ?What should I tell my care team before I take this medication? ?They need to know if you have any of these conditions: ?Anemia ?Dehydration ?Heart disease ?If you often drink alcohol ?Kidney disease ?Liver disease ?Polycystic ovary syndrome ?Serious infection or injury ?Vomiting ?An unusual or allergic reaction to metformin, other medications, foods,  dyes, or preservatives ?Pregnant or trying to get pregnant ?Breast-feeding ?How should I use this medication? ?Take this medication by mouth with a glass of water. Follow the directions on the prescripti

## 2021-09-28 ENCOUNTER — Encounter: Payer: Self-pay | Admitting: Gastroenterology

## 2021-09-28 LAB — CBC WITH DIFFERENTIAL/PLATELET
Basophils Absolute: 0.1 10*3/uL (ref 0.0–0.1)
Basophils Relative: 1.1 % (ref 0.0–3.0)
Eosinophils Absolute: 0.1 10*3/uL (ref 0.0–0.7)
Eosinophils Relative: 1.3 % (ref 0.0–5.0)
HCT: 40.6 % (ref 36.0–46.0)
Hemoglobin: 13.2 g/dL (ref 12.0–15.0)
Lymphocytes Relative: 25.4 % (ref 12.0–46.0)
Lymphs Abs: 2.9 10*3/uL (ref 0.7–4.0)
MCHC: 32.5 g/dL (ref 30.0–36.0)
MCV: 82.7 fl (ref 78.0–100.0)
Monocytes Absolute: 0.7 10*3/uL (ref 0.1–1.0)
Monocytes Relative: 6.2 % (ref 3.0–12.0)
Neutro Abs: 7.5 10*3/uL (ref 1.4–7.7)
Neutrophils Relative %: 66 % (ref 43.0–77.0)
Platelets: 324 10*3/uL (ref 150.0–400.0)
RBC: 4.91 Mil/uL (ref 3.87–5.11)
RDW: 15.4 % (ref 11.5–15.5)
WBC: 11.3 10*3/uL — ABNORMAL HIGH (ref 4.0–10.5)

## 2021-09-28 LAB — COMPREHENSIVE METABOLIC PANEL
ALT: 21 U/L (ref 0–35)
AST: 12 U/L (ref 0–37)
Albumin: 4.3 g/dL (ref 3.5–5.2)
Alkaline Phosphatase: 66 U/L (ref 39–117)
BUN: 13 mg/dL (ref 6–23)
CO2: 29 mEq/L (ref 19–32)
Calcium: 9.6 mg/dL (ref 8.4–10.5)
Chloride: 98 mEq/L (ref 96–112)
Creatinine, Ser: 0.68 mg/dL (ref 0.40–1.20)
GFR: 104.25 mL/min (ref >60.00)
Glucose, Bld: 125 mg/dL — ABNORMAL HIGH (ref 70–99)
Potassium: 4.3 mEq/L (ref 3.5–5.1)
Sodium: 136 mEq/L (ref 135–145)
Total Bilirubin: 0.4 mg/dL (ref 0.2–1.2)
Total Protein: 6.9 g/dL (ref 6.0–8.3)

## 2021-09-28 LAB — LIPASE: Lipase: 720 U/L — ABNORMAL HIGH (ref 11.0–59.0)

## 2021-09-28 LAB — HEMOGLOBIN A1C: Hgb A1c MFr Bld: 7.2 % — ABNORMAL HIGH (ref 4.6–6.5)

## 2021-09-28 LAB — LDL CHOLESTEROL, DIRECT: Direct LDL: 124 mg/dL

## 2021-09-28 LAB — AMYLASE: Amylase: 120 U/L (ref 27–131)

## 2021-09-28 NOTE — Progress Notes (Signed)
Dr. Allen Norris just [place] on mutual patient.  ? ?Lipase has increased. Patient is no longer on ozempic it was held at last visit. She had no abdominal pain on exam or reported on 09/27/2021 and she is scheduled with Dr. Allen Norris for MRI and colonoscopy she should keep close follow up. ?A1C is not controlled, Metformin was prescribed on 09/27/2021 but she will hold until she ha had further work up with gastroenterology.  ?CBC mild elevation. Need recheck in 2 weeks CBC, and report any signs of infection.  ?CMP ok besides elevated glucose.  ?Amylase within normal limits.  ?Direct LDL borderline high, diet and exercise changes advised.  ?Establish care with new PCP as soon as possible since this provider last day 10/06/21. Return to office prior if needed at anytime.

## 2021-09-29 ENCOUNTER — Ambulatory Visit: Payer: Managed Care, Other (non HMO) | Admitting: Anesthesiology

## 2021-09-29 ENCOUNTER — Encounter: Payer: Self-pay | Admitting: Gastroenterology

## 2021-09-29 ENCOUNTER — Ambulatory Visit
Admission: RE | Admit: 2021-09-29 | Discharge: 2021-09-29 | Disposition: A | Discharge status: Home / Self Care | Payer: Managed Care, Other (non HMO) | Attending: Gastroenterology | Admitting: Gastroenterology

## 2021-09-29 ENCOUNTER — Encounter
Admission: RE | Disposition: A | Discharge status: Home / Self Care | Payer: Self-pay | Source: Home / Self Care | Attending: Gastroenterology

## 2021-09-29 DIAGNOSIS — Z6841 Body Mass Index (BMI) 40.0 and over, adult: Secondary | ICD-10-CM | POA: Insufficient documentation

## 2021-09-29 DIAGNOSIS — Z7951 Long term (current) use of inhaled steroids: Secondary | ICD-10-CM | POA: Insufficient documentation

## 2021-09-29 DIAGNOSIS — I10 Essential (primary) hypertension: Secondary | ICD-10-CM | POA: Insufficient documentation

## 2021-09-29 DIAGNOSIS — J45909 Unspecified asthma, uncomplicated: Secondary | ICD-10-CM | POA: Diagnosis not present

## 2021-09-29 DIAGNOSIS — E119 Type 2 diabetes mellitus without complications: Secondary | ICD-10-CM | POA: Insufficient documentation

## 2021-09-29 DIAGNOSIS — K5730 Diverticulosis of large intestine without perforation or abscess without bleeding: Secondary | ICD-10-CM | POA: Insufficient documentation

## 2021-09-29 DIAGNOSIS — Z1211 Encounter for screening for malignant neoplasm of colon: Secondary | ICD-10-CM | POA: Diagnosis present

## 2021-09-29 DIAGNOSIS — K635 Polyp of colon: Secondary | ICD-10-CM | POA: Diagnosis not present

## 2021-09-29 DIAGNOSIS — Z79899 Other long term (current) drug therapy: Secondary | ICD-10-CM | POA: Diagnosis not present

## 2021-09-29 DIAGNOSIS — K640 First degree hemorrhoids: Secondary | ICD-10-CM | POA: Diagnosis not present

## 2021-09-29 HISTORY — DX: Unspecified asthma, uncomplicated: J45.909

## 2021-09-29 HISTORY — PX: COLONOSCOPY WITH PROPOFOL: SHX5780

## 2021-09-29 SURGERY — COLONOSCOPY WITH PROPOFOL
Anesthesia: General

## 2021-09-29 MED ORDER — DEXMEDETOMIDINE (PRECEDEX) IN NS 20 MCG/5ML (4 MCG/ML) IV SYRINGE
PREFILLED_SYRINGE | INTRAVENOUS | Status: DC | PRN
  Administered 2021-09-29: 4 ug via INTRAVENOUS
  Administered 2021-09-29: 8 ug via INTRAVENOUS

## 2021-09-29 MED ORDER — SODIUM CHLORIDE 0.9 % IV SOLN: INTRAVENOUS | Status: DC

## 2021-09-29 MED ORDER — PROPOFOL 500 MG/50ML IV EMUL
INTRAVENOUS | Status: AC
Start: 2021-09-29 — End: ?
  Filled 2021-09-29: qty 50

## 2021-09-29 MED ORDER — PROPOFOL 500 MG/50ML IV EMUL
INTRAVENOUS | Status: DC | PRN
Start: 2021-09-29 — End: 2021-09-29
  Administered 2021-09-29: 150 ug/kg/min via INTRAVENOUS

## 2021-09-29 MED ORDER — LIDOCAINE HCL (CARDIAC) PF 100 MG/5ML IV SOSY
PREFILLED_SYRINGE | INTRAVENOUS | Status: DC | PRN
  Administered 2021-09-29: 50 mg via INTRAVENOUS

## 2021-09-29 MED ORDER — LIDOCAINE HCL (PF) 2 % IJ SOLN
INTRAMUSCULAR | Status: AC
  Filled 2021-09-29: qty 5

## 2021-09-29 MED ORDER — PROPOFOL 10 MG/ML IV BOLUS
INTRAVENOUS | Status: DC | PRN
  Administered 2021-09-29: 30 mg via INTRAVENOUS
  Administered 2021-09-29: 40 mg via INTRAVENOUS
  Administered 2021-09-29: 70 mg via INTRAVENOUS

## 2021-09-29 NOTE — Anesthesia Procedure Notes (Signed)
Date/Time: 09/29/2021 9:30 AM ?Performed by: Johnna Acosta, CRNA ?Pre-anesthesia Checklist: Patient identified, Emergency Drugs available, Suction available, Patient being monitored and Timeout performed ?Patient Re-evaluated:Patient Re-evaluated prior to induction ?Oxygen Delivery Method: Supernova nasal CPAP ?Preoxygenation: Pre-oxygenation with 100% oxygen ?Induction Type: IV induction ? ? ? ? ?

## 2021-09-29 NOTE — Transfer of Care (Signed)
Immediate Anesthesia Transfer of Care Note ? ?Patient: Brandy Christensen ? ?Procedure(s) Performed: COLONOSCOPY WITH PROPOFOL ? ?Patient Location: PACU ? ?Anesthesia Type:General ? ?Level of Consciousness: awake and alert  ? ?Airway & Oxygen Therapy: Patient Spontanous Breathing ? ?Post-op Assessment: Report given to RN and Post -op Vital signs reviewed and stable ? ?Post vital signs: Reviewed and stable ? ?Last Vitals:  ?Vitals Value Taken Time  ?BP 126/74 09/29/21 1001  ?Temp    ?Pulse 99 09/29/21 1001  ?Resp 19 09/29/21 1001  ?SpO2 97 % 09/29/21 1001  ?Vitals shown include unvalidated device data. ? ?Last Pain:  ?Vitals:  ? 09/29/21 0852  ?TempSrc: Temporal  ?PainSc: 0-No pain  ?   ? ?  ? ?Complications: No notable events documented. ?

## 2021-09-29 NOTE — Anesthesia Postprocedure Evaluation (Signed)
Anesthesia Post Note ? ?Patient: Brandy Christensen ? ?Procedure(s) Performed: COLONOSCOPY WITH PROPOFOL ? ?Patient location during evaluation: PACU ?Anesthesia Type: General ?Level of consciousness: awake and alert ?Pain management: pain level controlled ?Vital Signs Assessment: post-procedure vital signs reviewed and stable ?Respiratory status: spontaneous breathing, nonlabored ventilation, respiratory function stable and patient connected to nasal cannula oxygen ?Cardiovascular status: blood pressure returned to baseline and stable ?Postop Assessment: no apparent nausea or vomiting ?Anesthetic complications: no ? ? ?No notable events documented. ? ? ?Last Vitals:  ?Vitals:  ? 09/29/21 0959 09/29/21 1001  ?BP: 126/74 126/74  ?Pulse: (!) 105 (!) 106  ?Resp: (!) 23 20  ?Temp: (!) 36.2 ?C   ?SpO2: 97% 97%  ?  ?Last Pain:  ?Vitals:  ? 09/29/21 0959  ?TempSrc: Temporal  ?PainSc: 0-No pain  ? ? ?  ?  ?  ?  ?  ?  ? ?Molli Barrows ? ? ? ? ?

## 2021-09-29 NOTE — H&P (Signed)
? ?Brandy Lame, MD [facility] ?[address] ?[place], [place] [address] ?Phone: [phone] ?Fax : [phone] ? ?Primary Care Physician:  Doreen Beam, FNP ?Primary Gastroenterologist:  Dr. Allen Norris ? ?Pre-Procedure History & Physical: ?HPI:  Brandy Christensen is a 46 y.o. female is here for a screening colonoscopy.  ? ?Past Medical History:  ?Diagnosis Date  ? ADHD (attention deficit hyperactivity disorder)   ? Asthma   ? ? ?Past Surgical History:  ?Procedure Laterality Date  ? BREAST BIOPSY Right   ? needle bx neg  ? thumb surgery    ? ? ?Prior to Admission medications   ?Medication Sig Start Date End Date Taking? Authorizing Provider  ?levocetirizine (XYZAL) 5 MG tablet Take 1 tablet (5 mg total) by mouth every evening. 09/27/21  Yes Flinchum, Kelby Aline, FNP  ?nortriptyline (PAMELOR) 25 MG capsule SMARTSIG:1 Capsule(s) By Mouth Every Evening 09/27/21  Yes Flinchum, Kelby Aline, FNP  ?SYMBICORT 160-4.5 MCG/ACT inhaler SMARTSIG:2 Puff(s) By Mouth Twice Daily 12/01/20  Yes [provider]  ?albuterol (VENTOLIN HFA) 108 (90 Base) MCG/ACT inhaler Inhale 2 puffs into the lungs every 4 (four) hours as needed. 12/01/20   [provider]  ?diazepam (VALIUM) 5 MG tablet Take 1 tablet 1 hour prior to MRI, DO NOT DRIVE 09/19/12   Brandy Lame, MD  ?EPINEPHrine 0.3 mg/0.3 mL IJ SOAJ injection INJECT INTO OUTER THIGH FOR SEVERE ALLERGIC REACTION. CALL 911 AFTER USE. MAY SUBSTITUTE ANY BRAND 06/23/19   [provider]  ?metFORMIN (GLUCOPHAGE-XR) 500 MG 24 hr tablet Take 1 tablet (500 mg total) by mouth daily with breakfast. 09/27/21   Flinchum, Kelby Aline, FNP  ?Multiple Vitamin (MULTIVITAMIN) tablet Take 1 tablet by mouth daily.    [provider]  ? ? ?Allergies as of 08/24/2021 - Review Complete 08/24/2021  ?Allergen Reaction Noted  ? Fluticasone Anaphylaxis 01/19/2021  ? Levofloxacin Anaphylaxis 01/19/2021  ? Losartan Other (See Comments) 01/19/2021  ? Metformin and related  06/29/2021  ?  Prednisone Other (See Comments) 12/23/2020  ? ? ?Family History  ?Problem Relation Age of Onset  ? Breast cancer Mother 49  ? Cancer Mother   ?     breast  ? Learning disabilities Father   ? Heart disease Father   ? Hearing loss Maternal Grandmother   ? Breast cancer Maternal Grandmother   ? Hearing loss Maternal Grandfather   ? ? ?Social History  ? ?Socioeconomic History  ? Marital status: Single  ?  Spouse name: Not on file  ? Number of children: Not on file  ? Years of education: Not on file  ? Highest education level: Not on file  ?Occupational History  ? Not on file  ?Tobacco Use  ? Smoking status: Never  ? Smokeless tobacco: Never  ?Vaping Use  ? Vaping Use: Never used  ?Substance and Sexual Activity  ? Alcohol use: Not Currently  ? Drug use: Not Currently  ? Sexual activity: Not Currently  ?Other Topics Concern  ? Not on file  ?Social History Narrative  ? Not on file  ? ?Social Determinants of Health  ? ?Financial Resource Strain: Not on file  ?Food Insecurity: Not on file  ?Transportation Needs: Not on file  ?Physical Activity: Not on file  ?Stress: Not on file  ?Social Connections: Not on file  ?Intimate Partner Violence: Not on file  ? ? ?Review of Systems: ?See HPI, otherwise negative ROS ? ?Physical Exam: ?BP (!) 175/100   Pulse (!) 105   Temp (!)  96.6 ?F (35.9 ?C) (Temporal)   Resp 20   Ht '5\' 3"'$  (1.6 m)   Wt (!) 152 kg   SpO2 98%   BMI 59.34 kg/m?  ?General:   Alert,  pleasant and cooperative in NAD ?Head:  Normocephalic and atraumatic. ?Neck:  Supple; no masses or thyromegaly. ?Lungs:  Clear throughout to auscultation.    ?Heart:  Regular rate and rhythm. ?Abdomen:  Soft, nontender and nondistended. Normal bowel sounds, without guarding, and without rebound.   ?Neurologic:  Alert and  oriented x4;  grossly normal neurologically. ? ?Impression/Plan: ?Brandy Christensen is now here to undergo a screening colonoscopy. ? ?Risks, benefits, and alternatives regarding colonoscopy have been reviewed with the  patient.  Questions have been answered.  All parties agreeable. ?

## 2021-09-29 NOTE — Op Note (Signed)
[facility] ?Gastroenterology ?Patient Name: Brandy Christensen ?Procedure Date: 09/29/2021 9:26 AM ?MRN: [id number] ?Account #: [id number] ?Date of Birth: [birth year] ?Admit Type: Outpatient ?Age: 46 ?Room: [facility] ENDO ROOM 3 ?Gender: Female ?Note Status: Finalized ?Instrument Name: Colonoscope [id number] ?Procedure:             Colonoscopy ?Indications:           Screening for colorectal malignant neoplasm ?Providers:             Lucilla Lame MD, MD ?Referring MD:          Kelby Aline. Flinchum (Referring MD) ?Medicines:             Propofol per Anesthesia ?Complications:         No immediate complications. ?Procedure:             Pre-Anesthesia Assessment: ?                       - Prior to the procedure, a History and Physical was  ?                       performed, and patient medications and allergies were  ?                       reviewed. The patient's tolerance of previous  ?                       anesthesia was also reviewed. The risks and benefits  ?                       of the procedure and the sedation options and risks  ?                       were discussed with the patient. All questions were  ?                       answered, and informed consent was obtained. Prior  ?                       Anticoagulants: The patient has taken no previous  ?                       anticoagulant or antiplatelet agents. ASA Grade  ?                       Assessment: II - A patient with mild systemic disease.  ?                       After reviewing the risks and benefits, the patient  ?                       was deemed in satisfactory condition to undergo the  ?                       procedure. ?                       After obtaining informed consent, the colonoscope was  ?  passed under direct vision. Throughout the procedure,  ?                       the patient's blood pressure, pulse, and oxygen  ?                       saturations were monitored continuously. The  ?                        Colonoscope was introduced through the anus and  ?                       advanced to the the cecum, identified by appendiceal  ?                       orifice and ileocecal valve. The colonoscopy was  ?                       performed without difficulty. The patient tolerated  ?                       the procedure well. The quality of the bowel  ?                       preparation was excellent. ?Findings: ?     The perianal and digital rectal examinations were normal. ?     A 5 mm polyp was found in the rectum. The polyp was sessile. The polyp  ?     was removed with a cold biopsy forceps. Resection and retrieval were  ?     complete. ?     Non-bleeding internal hemorrhoids were found during retroflexion. The  ?     hemorrhoids were Grade I (internal hemorrhoids that do not prolapse). ?     A few small-mouthed diverticula were found in the sigmoid colon. ?Impression:            - One 5 mm polyp in the rectum, removed with a cold  ?                       biopsy forceps. Resected and retrieved. ?                       - Non-bleeding internal hemorrhoids. ?                       - Diverticulosis in the sigmoid colon. ?Recommendation:        - Discharge patient to home. ?                       - Resume previous diet. ?                       - Continue present medications. ?                       - Await pathology results. ?                       - If the pathology report reveals adenomatous tissue,  ?  then repeat the colonoscopy for surveillance in 7  ?                       years otherwise 10 years. ?Procedure Code(s):     --- Professional --- ?                       [phone], Colonoscopy, flexible; with biopsy, single or  ?                       multiple ?Diagnosis Code(s):     --- Professional --- ?                       Z12.11, Encounter for screening for malignant neoplasm  ?                       of colon ?                       K62.1, Rectal polyp ?CPT copyright 2019 American Medical  Association. All rights reserved. ?The codes documented in this report are preliminary and upon coder review may  ?be revised to meet current compliance requirements. ?Lucilla Lame MD, MD ?09/29/2021 9:56:03 AM ?This report has been signed electronically. ?Number of Addenda: 0 ?Note Initiated On: 09/29/2021 9:26 AM ?Scope Withdrawal Time: 0 hours 6 minutes 14 seconds  ?Total Procedure Duration: 0 hours 15 minutes 19 seconds  ?Estimated Blood Loss:  Estimated blood loss: none. ?     [facility] ?

## 2021-09-29 NOTE — Anesthesia Preprocedure Evaluation (Signed)
Anesthesia Evaluation  ?Patient identified by MRN, date of birth, ID band ?Patient awake ? ? ? ?Reviewed: ?Allergy & Precautions, H&P , NPO status , Patient's Chart, lab work & pertinent test results, reviewed documented beta blocker date and time  ? ?Airway ?Mallampati: III ? ? ?Neck ROM: full ? ? ? Dental ? ?(+) Poor Dentition ?  ?Pulmonary ?neg shortness of breath, asthma ,  ?  ?Pulmonary exam normal ? ? ? ? ? ? ? Cardiovascular ?Exercise Tolerance: Good ?hypertension, On Medications ?Normal cardiovascular exam ?Rhythm:regular Rate:Normal ? ? ?  ?Neuro/Psych ?PSYCHIATRIC DISORDERS negative neurological ROS ?   ? GI/Hepatic ?negative GI ROS, Neg liver ROS,   ?Endo/Other  ?diabetes, Well ControlledMorbid obesity ? Renal/GU ?negative Renal ROS  ?negative genitourinary ?  ?Musculoskeletal ? ? Abdominal ?  ?Peds ? Hematology ?negative hematology ROS ?(+)   ?Anesthesia Other Findings ?Past Medical History: ?No date: ADHD (attention deficit hyperactivity disorder) ?No date: Asthma ?Past Surgical History: ?No date: BREAST BIOPSY; Right ?    Comment:  needle bx neg ?No date: thumb surgery ?BMI   ? Body Mass Index: 59.34 kg/m?  ?  ? Reproductive/Obstetrics ?negative OB ROS ? ?  ? ? ? ? ? ? ? ? ? ? ? ? ? ?  ?  ? ? ? ? ? ? ? ? ?Anesthesia Physical ?Anesthesia Plan ? ?ASA: 3 ? ?Anesthesia Plan: General  ? ?Post-op Pain Management:   ? ?Induction:  ? ?PONV Risk Score and Plan:  ? ?Airway Management Planned:  ? ?Additional Equipment:  ? ?Intra-op Plan:  ? ?Post-operative Plan:  ? ?Informed Consent: I have reviewed the patients History and Physical, chart, labs and discussed the procedure including the risks, benefits and alternatives for the proposed anesthesia with the patient or authorized representative who has indicated his/her understanding and acceptance.  ? ? ? ?Dental Advisory Given ? ?Plan Discussed with: CRNA ? ?Anesthesia Plan Comments: (Refuses upreg. Denies female partner sex for  over 10 years. )  ? ? ? ? ? ? ?Anesthesia Quick Evaluation ? ?

## 2021-09-30 ENCOUNTER — Encounter: Payer: Self-pay | Admitting: Gastroenterology

## 2021-09-30 LAB — SURGICAL PATHOLOGY

## 2021-10-03 ENCOUNTER — Encounter: Payer: Self-pay | Admitting: Gastroenterology

## 2021-10-11 ENCOUNTER — Ambulatory Visit
Admission: RE | Admit: 2021-10-11 | Discharge: 2021-10-11 | Disposition: A | Discharge status: Home / Self Care | Payer: Managed Care, Other (non HMO) | Source: Ambulatory Visit | Attending: Gastroenterology | Admitting: Gastroenterology

## 2021-10-11 ENCOUNTER — Other Ambulatory Visit: Payer: Self-pay

## 2021-10-11 DIAGNOSIS — K859 Acute pancreatitis without necrosis or infection, unspecified: Secondary | ICD-10-CM | POA: Diagnosis not present

## 2021-10-11 MED ORDER — GADOBUTROL 1 MMOL/ML IV SOLN
10.0000 mL | Freq: Once | INTRAVENOUS | Status: AC | PRN
  Administered 2021-10-11: 10 mL via INTRAVENOUS

## 2021-10-13 ENCOUNTER — Ambulatory Visit (INDEPENDENT_AMBULATORY_CARE_PROVIDER_SITE_OTHER): Payer: Managed Care, Other (non HMO) | Admitting: Family

## 2021-10-13 ENCOUNTER — Encounter: Payer: Self-pay | Admitting: Family

## 2021-10-13 VITALS — BP 138/86 | HR 93 | Temp 99.8°F | Resp 16 | Ht 63.0 in | Wt 341.5 lb

## 2021-10-13 DIAGNOSIS — J4530 Mild persistent asthma, uncomplicated: Secondary | ICD-10-CM | POA: Diagnosis not present

## 2021-10-13 DIAGNOSIS — F902 Attention-deficit hyperactivity disorder, combined type: Secondary | ICD-10-CM

## 2021-10-13 DIAGNOSIS — E119 Type 2 diabetes mellitus without complications: Secondary | ICD-10-CM

## 2021-10-13 DIAGNOSIS — R635 Abnormal weight gain: Secondary | ICD-10-CM | POA: Diagnosis not present

## 2021-10-13 DIAGNOSIS — J302 Other seasonal allergic rhinitis: Secondary | ICD-10-CM

## 2021-10-13 DIAGNOSIS — L409 Psoriasis, unspecified: Secondary | ICD-10-CM

## 2021-10-13 DIAGNOSIS — Z6841 Body Mass Index (BMI) 40.0 and over, adult: Secondary | ICD-10-CM | POA: Insufficient documentation

## 2021-10-13 DIAGNOSIS — D3501 Benign neoplasm of right adrenal gland: Secondary | ICD-10-CM

## 2021-10-13 DIAGNOSIS — E782 Mixed hyperlipidemia: Secondary | ICD-10-CM

## 2021-10-13 DIAGNOSIS — Z8719 Personal history of other diseases of the digestive system: Secondary | ICD-10-CM

## 2021-10-13 MED ORDER — METFORMIN HCL ER 500 MG PO TB24: 500.0000 mg | ORAL_TABLET | Freq: Two times a day (BID) | ORAL | 1 refills | Status: DC

## 2021-10-13 MED ORDER — ALBUTEROL SULFATE HFA 108 (90 BASE) MCG/ACT IN AERS: 2.0000 | INHALATION_SPRAY | RESPIRATORY_TRACT | 1 refills | Status: DC | PRN

## 2021-10-13 NOTE — Assessment & Plan Note (Signed)
New finding.  ?Reviewed most recent MRCP ordered by GI.  ?pt to f/u with GI for ongoing eval of elevated lipase/suspected pancreatitis  ?Referral to endo for abn weight gain, r/o cushings ?

## 2021-10-13 NOTE — Assessment & Plan Note (Signed)
Reviewed recent lipase level as well as MRCP ?Pt to f/u with gI  ?

## 2021-10-13 NOTE — Assessment & Plan Note (Signed)
Unable to tolerate flonase ?Continue allergy shots ?Continue xyzal 5 mg ?

## 2021-10-13 NOTE — Progress Notes (Signed)
? ?Established Patient Office Visit ? ?Subjective:  ?Patient ID: Brandy Christensen, female    DOB: [birth year]  Age: 46 y.o. MRN: [id number] ? ?CC:  ?Chief Complaint  ?Patient presents with  ? Establish Care  ? Referral  ? ? ?HPI ?Brandy Christensen is here for a transition of care visit. ? ?Prior provider was: Brandy Peace, FNP  ?Pt is without acute concerns.  ?  ?FYI, from pt, in her 20's she gained about 80 pounds for no reason. Was dx with PCOS and placed on birth control pills and metformin. She continued to gain wait, and was found to have to have elevated cortisol levels. Two years ago had 'what looked like pancreatitis' that was caused by ozempic.  ? ?Weight gain, states barely eats any sugar foods. Doesn't eat fast food because she is allergic to corn. She prepares all of her foods at home, and feels she eats pretty healthy. Walks 15-30 minutes everyday on her treadmill on home.  ?Lab Results  ?Component Value Date  ? TSH 2.00 06/29/2021  ? ?Lipase elevation: pancreatitis was suspected. Pt seeing Dr. Lucilla Lame, GI. 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 She does state elevated cortisol in the past.did see some liver enlargement significant for fatty liver. Pending GI f/u in the near future.   ?Lab Results  ?Component Value Date  ? LIPASE 720.0 (H) 09/27/2021  ? ?DM2: currently taking metformin 500 mg once daily.  ?Lab Results  ?Component Value Date  ? HGBA1C 7.2 (H) 09/27/2021  ? ?Add/anxiety: diagnosed in 28 by psychiatrist. Started on nortriptyline 25 mg once daily and doing well with this. Helps with focus.  ? ?Asthma: sees pulmonologist every six months. Sees allergy center at [place]. Also on allergy injections once a month. Has epi pen on her at all times. Takes xyzal. Can not do flonase, allergist to steroid in flonase.  ? ? ? ?Past Medical History:  ?Diagnosis Date  ? ADHD (attention deficit hyperactivity disorder)   ? Asthma   ? ? ?Past Surgical History:   ?Procedure Laterality Date  ? BREAST BIOPSY Right   ? needle bx neg  ? COLONOSCOPY WITH PROPOFOL N/A 09/29/2021  ? Procedure: COLONOSCOPY WITH PROPOFOL;  Surgeon: Lucilla Lame, MD;  Location[facility] ENDOSCOPY;  Service: Endoscopy;  Laterality: N/A;  ? thumb surgery    ? ? ?Family History  ?Problem Relation Age of Onset  ? Breast cancer Mother 82  ? Cancer Mother   ?     breast  ? Crohn's disease Mother   ? Lymphoma Mother   ? Learning disabilities Father   ? Heart disease Father   ? Hearing loss Maternal Grandmother   ? Breast cancer Maternal Grandmother   ? Hearing loss Maternal Grandfather   ? ? ?Social History  ? ?Socioeconomic History  ? Marital status: Single  ?  Spouse name: Not on file  ? Number of children: 0  ? Years of education: Not on file  ? Highest education level: Not on file  ?Occupational History  ?  Employer: [employer]  ?Tobacco Use  ? Smoking status: Never  ? Smokeless tobacco: Never  ?Vaping Use  ? Vaping Use: Never used  ?Substance and Sexual Activity  ? Alcohol use: Not Currently  ? Drug use: Not Currently  ? Sexual activity: Not Currently  ?  Partners: Female  ?Other Topics Concern  ? Not on file  ?Social History Narrative  ? Not on  file  ? ?Social Determinants of Health  ? ?Financial Resource Strain: Not on file  ?Food Insecurity: Not on file  ?Transportation Needs: Not on file  ?Physical Activity: Not on file  ?Stress: Not on file  ?Social Connections: Not on file  ?Intimate Partner Violence: Not on file  ? ? ?Outpatient Medications Prior to Visit  ?Medication Sig Dispense Refill  ? EPINEPHrine 0.3 mg/0.3 mL IJ SOAJ injection INJECT INTO OUTER THIGH FOR SEVERE ALLERGIC REACTION. CALL 911 AFTER USE. MAY SUBSTITUTE ANY BRAND    ? levocetirizine (XYZAL) 5 MG tablet Take 1 tablet (5 mg total) by mouth every evening. 90 tablet 1  ? Multiple Vitamin (MULTIVITAMIN) tablet Take 1 tablet by mouth daily.    ? nortriptyline (PAMELOR) 25 MG capsule SMARTSIG:1 Capsule(s) By Mouth Every Evening 30  capsule 1  ? SYMBICORT 160-4.5 MCG/ACT inhaler SMARTSIG:2 Puff(s) By Mouth Twice Daily    ? albuterol (VENTOLIN HFA) 108 (90 Base) MCG/ACT inhaler Inhale 2 puffs into the lungs every 4 (four) hours as needed.    ? metFORMIN (GLUCOPHAGE-XR) 500 MG 24 hr tablet Take 1 tablet (500 mg total) by mouth daily with breakfast. 30 tablet 1  ? diazepam (VALIUM) 5 MG tablet Take 1 tablet 1 hour prior to MRI, DO NOT DRIVE (Patient not taking: Reported on 10/13/2021) 1 tablet 0  ? ?No facility-administered medications prior to visit.  ? ? ?Allergies  ?Allergen Reactions  ? Fluticasone Anaphylaxis  ?  Throat closing, unable to breathe   ? Levofloxacin Anaphylaxis  ?  Throat closing, unable to breathe   ? Ozempic (0.25 Or 0.5 Mg-Dose) [Semaglutide(0.25 Or 0.'5mg'$ -Dos)] Other (See Comments)  ?  Severe pancreatitis   ? Losartan Other (See Comments)  ?  Low diastolic, fainting   ? Metformin And Related   ?  Metformin regular - GI upset. - mild to moderate.   ? Prednisone Other (See Comments)  ?  Paranoia. Also allergic to most steroids   ? ? ?ROS ?Review of Systems  ?Constitutional:  Positive for fatigue and unexpected weight change (weight gain). Negative for chills and fever.  ?Eyes:  Negative for visual disturbance.  ?Respiratory:  Negative for shortness of breath.   ?Cardiovascular:  Negative for chest pain.  ?Gastrointestinal:  Negative for abdominal pain.  ?Genitourinary:  Negative for difficulty urinating.  ?Skin:  Negative for rash.  ?Neurological:  Negative for dizziness and headaches.  ? ? ? ?  ?Objective:  ?  ?Physical Exam ?Constitutional:   ?   General: She is not in acute distress. ?   Appearance: Normal appearance. She is obese. She is not ill-appearing, toxic-appearing or diaphoretic.  ?Cardiovascular:  ?   Rate and Rhythm: Normal rate and regular rhythm.  ?Pulmonary:  ?   Effort: Pulmonary effort is normal.  ?   Breath sounds: Normal breath sounds.  ?Musculoskeletal:  ?   Right lower leg: 1+ Pitting Edema present.  ?    Left lower leg: 1+ Pitting Edema present.  ?   Right ankle: Swelling present.  ?   Left ankle: Swelling present.  ?Skin: ?   General: Skin is warm.  ?   Findings: Rash (erythematic base on bil knee with silver scaling) present.  ?Neurological:  ?   Mental Status: She is alert.  ? ? ? ? ?BP 138/86   Pulse 93   Temp 99.8 ?F (37.7 ?C)   Resp 16   Ht '5\' 3"'$  (1.6 m)   Wt (!) 341 lb 8  oz (154.9 kg)   LMP 09/28/2021   SpO2 97%   BMI 60.49 kg/m?  ?Wt Readings from Last 3 Encounters:  ?10/13/21 (!) 341 lb 8 oz (154.9 kg)  ?09/29/21 (!) 335 lb (152 kg)  ?09/27/21 (!) 343 lb (155.6 kg)  ? ? ? ?Health Maintenance Due  ?Topic Date Due  ? FOOT EXAM  Never done  ? OPHTHALMOLOGY EXAM  Never done  ? ? ?There are no preventive care reminders to display for this patient. ? ?Lab Results  ?Component Value Date  ? TSH 2.00 06/29/2021  ? ?Lab Results  ?Component Value Date  ? WBC 11.3 (H) 09/27/2021  ? HGB 13.2 09/27/2021  ? HCT 40.6 09/27/2021  ? MCV 82.7 09/27/2021  ? PLT 324.0 09/27/2021  ? ?Lab Results  ?Component Value Date  ? NA 136 09/27/2021  ? K 4.3 09/27/2021  ? CO2 29 09/27/2021  ? GLUCOSE 125 (H) 09/27/2021  ? BUN 13 09/27/2021  ? CREATININE 0.68 09/27/2021  ? BILITOT 0.4 09/27/2021  ? ALKPHOS 66 09/27/2021  ? AST 12 09/27/2021  ? ALT 21 09/27/2021  ? PROT 6.9 09/27/2021  ? ALBUMIN 4.3 09/27/2021  ? CALCIUM 9.6 09/27/2021  ? GFR 104.25 09/27/2021  ? ?Lab Results  ?Component Value Date  ? CHOL 190 06/29/2021  ? ?Lab Results  ?Component Value Date  ? HDL 40.70 06/29/2021  ? ?Lab Results  ?Component Value Date  ? LDLCALC 122 (H) 06/29/2021  ? ?Lab Results  ?Component Value Date  ? TRIG 136.0 06/29/2021  ? ?Lab Results  ?Component Value Date  ? CHOLHDL 5 06/29/2021  ? ?Lab Results  ?Component Value Date  ? HGBA1C 7.2 (H) 09/27/2021  ? ? ?  ?Assessment & Plan:  ? ?Problem List Items Addressed This Visit   ? ?  ? Respiratory  ? Mild persistent asthma  ?  Continue symbicort 160-4.5 mg ?rx for refill albuterol HFA 108  mcg ?continue f/u with pulmonary as scheduled ?  ?  ? Relevant Medications  ? albuterol (VENTOLIN HFA) 108 (90 Base) MCG/ACT inhaler  ?  ? Endocrine  ? Diabetes mellitus without complication ([place])  ?  hga1c reviewed. ?Work

## 2021-10-13 NOTE — Assessment & Plan Note (Signed)
Helped by management with nortriptyline as helps with anxiety to increase focus ? ?

## 2021-10-13 NOTE — Assessment & Plan Note (Signed)
Continue symbicort 160-4.5 mg ?rx for refill albuterol HFA 108 mcg ?continue f/u with pulmonary as scheduled ?

## 2021-10-13 NOTE — Assessment & Plan Note (Signed)
hga1c reviewed. ?Work on diabetic diet, exercise as tolerated.  ?Increase metformin to 500 mg XR twice daily ?

## 2021-10-13 NOTE — Assessment & Plan Note (Signed)
Referral to endo.  ?Reviewed recent TSH no abn findings ?New finding adrenal gland, refer to end. R/o cushings ?

## 2021-10-13 NOTE — Assessment & Plan Note (Signed)
Manage as able. Allergic to steroid, unable to prescribe clobetasol ?

## 2021-10-13 NOTE — Assessment & Plan Note (Signed)
Reviewed last lipid panel, acceptable.  ?Work on low cholesterol diet and exercise as tolerated ? ?

## 2021-10-13 NOTE — Patient Instructions (Addendum)
A referral was placed today for endo.  ?Please let us know if you have not heard back within 1 week about your referral. ? ? ? ?

## 2021-10-13 NOTE — Assessment & Plan Note (Signed)
Work on diet and exercise 

## 2021-10-19 ENCOUNTER — Telehealth: Payer: Self-pay | Admitting: Family

## 2021-10-19 NOTE — Telephone Encounter (Signed)
Patient states she has not received a call about her endocrinology referral. The patient did call Endo and they stated they have not started the process/reviewed plus they will not have anything available until October. Please advise  ?

## 2021-10-25 ENCOUNTER — Encounter: Payer: Self-pay | Admitting: Family

## 2021-10-28 ENCOUNTER — Encounter: Payer: Self-pay | Admitting: Gastroenterology

## 2021-11-15 NOTE — Telephone Encounter (Signed)
Message sent to patient via mychart about Endo availability ? ?[facility] is not scheduling patients before October - not seeing any urgent cases.  ? ?Referrals will have to go to another location ? ? ?

## 2022-01-13 ENCOUNTER — Other Ambulatory Visit: Payer: Self-pay | Admitting: Family

## 2022-01-13 ENCOUNTER — Encounter: Payer: Self-pay | Admitting: Family

## 2022-01-13 ENCOUNTER — Ambulatory Visit (INDEPENDENT_AMBULATORY_CARE_PROVIDER_SITE_OTHER)
Admission: RE | Admit: 2022-01-13 | Discharge: 2022-01-13 | Disposition: A | Discharge status: Home / Self Care | Payer: 59 | Source: Ambulatory Visit | Attending: Family | Admitting: Family

## 2022-01-13 ENCOUNTER — Ambulatory Visit (INDEPENDENT_AMBULATORY_CARE_PROVIDER_SITE_OTHER): Payer: 59 | Admitting: Family

## 2022-01-13 VITALS — BP 142/84 | HR 92 | Temp 98.7°F | Resp 16 | Ht 63.0 in | Wt 334.2 lb

## 2022-01-13 DIAGNOSIS — R748 Abnormal levels of other serum enzymes: Secondary | ICD-10-CM | POA: Insufficient documentation

## 2022-01-13 DIAGNOSIS — M25561 Pain in right knee: Secondary | ICD-10-CM | POA: Insufficient documentation

## 2022-01-13 DIAGNOSIS — R600 Localized edema: Secondary | ICD-10-CM | POA: Insufficient documentation

## 2022-01-13 DIAGNOSIS — F909 Attention-deficit hyperactivity disorder, unspecified type: Secondary | ICD-10-CM | POA: Diagnosis not present

## 2022-01-13 DIAGNOSIS — D3501 Benign neoplasm of right adrenal gland: Secondary | ICD-10-CM

## 2022-01-13 DIAGNOSIS — E119 Type 2 diabetes mellitus without complications: Secondary | ICD-10-CM | POA: Diagnosis not present

## 2022-01-13 NOTE — Assessment & Plan Note (Signed)
Pending consult with endo

## 2022-01-13 NOTE — Patient Instructions (Signed)
A referral was placed today for vascular for swelling lower legs  Please let us know if you have not heard back within 2 weeks about the referral. Wear compression stockings  Elevate legs   Stop by the lab prior to leaving today. I will notify you of your results once received.   Due to recent changes in healthcare laws, you may see results of your imaging and/or laboratory studies on MyChart before I have had a chance to review them.  I understand that in some cases there may be results that are confusing or concerning to you. Please understand that not all results are received at the same time and often I may need to interpret multiple results in order to provide you with the best plan of care or course of treatment. Therefore, I ask that you please give me 2 business days to thoroughly review all your results before contacting my office for clarification. Should we see a critical lab result, you will be contacted sooner.   It was a pleasure seeing you today! Please do not hesitate to reach out with any questions and or concerns.  Regards,   Eugenia Pancoast FNP-C

## 2022-01-13 NOTE — Assessment & Plan Note (Signed)
Repeat lipase, then will reach out to GI if still elevated (Dr. Allen Norris)

## 2022-01-13 NOTE — Assessment & Plan Note (Signed)
Continue metformin 500 mg once daily Order a1c pending results Work on diabetic diet exercise as tolerated Continue ozempic 0.5 mg weekly

## 2022-01-13 NOTE — Progress Notes (Signed)
Established Patient Office Visit  Subjective:  Patient ID: Brandy Christensen, female    DOB: April 17, 1976  Age: 46 y.o. MRN: [id number]  CC:  Chief Complaint  Patient presents with   Diabetes    HPI Brandy Christensen is here today for follow up.   Pt is with acute concerns.  July 26th has appt with [facility] endocrinologist for adenoma.   MRI with Dr. Allen Norris, GI, showing fatty liver. Colonoscopy repeat in ten years, polyp was not pre cancerous.   Lipase elevated at 720. Followed by Dr. Allen Norris .  DM2: last hga1c 7.2 , taking metformin XR 500 mg twice daily. Tolerating well.  Lab Results  Component Value Date   HGBA1C 7.2 (H) 09/27/2021   Right knee pain, hurts when she was walking on the treadmill. Pain medial area of right knee. Soft and sore when pressing on it.   Small right adrenal gland benign appearing adenoma, has appt with endo at [facility] in July. Pending this appt.  Wt Readings from Last 3 Encounters:  01/13/22 (!) 334 lb 3 oz (151.6 kg)  10/13/21 (!) 341 lb 8 oz (154.9 kg)  09/29/21 (!) 335 lb (152 kg)    Has noted that she has increased bil pedal edema since starting ozempic. She does not take it any longer. She still has edema that is pitting. No pain in bil lower extremities.   Past Medical History:  Diagnosis Date   ADHD (attention deficit hyperactivity disorder)    Asthma     Past Surgical History:  Procedure Laterality Date   BREAST BIOPSY Right    needle bx neg   COLONOSCOPY WITH PROPOFOL N/A 09/29/2021   Procedure: COLONOSCOPY WITH PROPOFOL;  Surgeon: Lucilla Lame, MD;  Location: [facility] ENDOSCOPY;  Service: Endoscopy;  Laterality: N/A;   thumb surgery      Family History  Problem Relation Age of Onset   Breast cancer Mother 64   Cancer Mother        breast   Crohn's disease Mother    Lymphoma Mother    Learning disabilities Father    Heart disease Father    Hearing loss Maternal Grandmother    Breast cancer Maternal Grandmother    Hearing loss Maternal  Grandfather     Social History   Socioeconomic History   Marital status: Single    Spouse name: Not on file   Number of children: 0   Years of education: Not on file   Highest education level: Not on file  Occupational History    Employer: [employer]  Tobacco Use   Smoking status: Never   Smokeless tobacco: Never  Vaping Use   Vaping Use: Never used  Substance and Sexual Activity   Alcohol use: Not Currently   Drug use: Not Currently   Sexual activity: Not Currently    Partners: Female  Other Topics Concern   Not on file  Social History Narrative   Not on file   Social Determinants of Health   Financial Resource Strain: Not on file  Food Insecurity: Not on file  Transportation Needs: Not on file  Physical Activity: Not on file  Stress: Not on file  Social Connections: Not on file  Intimate Partner Violence: Not on file    Outpatient Medications Prior to Visit  Medication Sig Dispense Refill   ADDERALL XR 20 MG 24 hr capsule Take 20 mg by mouth every morning.     EPINEPHrine 0.3 mg/0.3 mL IJ SOAJ injection INJECT INTO  OUTER THIGH FOR SEVERE ALLERGIC REACTION. CALL 911 AFTER USE. MAY SUBSTITUTE ANY BRAND     levocetirizine (XYZAL) 5 MG tablet Take 1 tablet (5 mg total) by mouth every evening. 90 tablet 1   metFORMIN (GLUCOPHAGE-XR) 500 MG 24 hr tablet Take 1 tablet (500 mg total) by mouth 2 (two) times daily. 180 tablet 1   Multiple Vitamin (MULTIVITAMIN) tablet Take 1 tablet by mouth daily.     nortriptyline (PAMELOR) 25 MG capsule SMARTSIG:1 Capsule(s) By Mouth Every Evening 30 capsule 1   SYMBICORT 160-4.5 MCG/ACT inhaler SMARTSIG:2 Puff(s) By Mouth Twice Daily     albuterol (VENTOLIN HFA) 108 (90 Base) MCG/ACT inhaler Inhale 2 puffs into the lungs every 4 (four) hours as needed. 1 each 1   No facility-administered medications prior to visit.    Allergies  Allergen Reactions   Fluticasone Anaphylaxis    Throat closing, unable to breathe    Levofloxacin  Anaphylaxis    Throat closing, unable to breathe    Ozempic (0.25 Or 0.5 Mg-Dose) [Semaglutide(0.25 Or 0.'5mg'$ -Dos)] Other (See Comments)    Severe pancreatitis    Losartan Other (See Comments)    Low diastolic, fainting    Metformin And Related     Metformin regular - GI upset. - mild to moderate.    Prednisone Other (See Comments)    Paranoia. Also allergic to most steroids          Objective:    Physical Exam Constitutional:      General: She is not in acute distress.    Appearance: Normal appearance. She is obese. She is not ill-appearing, toxic-appearing or diaphoretic.  Cardiovascular:     Rate and Rhythm: Normal rate and regular rhythm.     Pulses:          Dorsalis pedis pulses are 2+ on the right side and 2+ on the left side.       Posterior tibial pulses are 2+ on the right side and 2+ on the left side.  Pulmonary:     Effort: Pulmonary effort is normal.     Breath sounds: Normal breath sounds.  Musculoskeletal:     Right lower leg: 3+ Pitting Edema present.     Left lower leg: 3+ Pitting Edema present.  Neurological:     General: No focal deficit present.     Mental Status: She is alert and oriented to person, place, and time.  Psychiatric:        Mood and Affect: Mood normal.        Behavior: Behavior normal.        Thought Content: Thought content normal.        Judgment: Judgment normal.      BP (!) 142/84   Pulse 92   Temp 98.7 F (37.1 C)   Resp 16   Ht '5\' 3"'$  (1.6 m)   Wt (!) 334 lb 3 oz (151.6 kg)   LMP 12/23/2021 (Approximate)   SpO2 98%   BMI 59.20 kg/m  Wt Readings from Last 3 Encounters:  01/13/22 (!) 334 lb 3 oz (151.6 kg)  10/13/21 (!) 341 lb 8 oz (154.9 kg)  09/29/21 (!) 335 lb (152 kg)     Health Maintenance Due  Topic Date Due   FOOT EXAM  Never done   OPHTHALMOLOGY EXAM  Never done   URINE MICROALBUMIN  01/19/2022    There are no preventive care reminders to display for this patient.  Lab Results  Component Value Date  TSH 2.00 06/29/2021   Lab Results  Component Value Date   WBC 11.3 (H) 09/27/2021   HGB 13.2 09/27/2021   HCT 40.6 09/27/2021   MCV 82.7 09/27/2021   PLT 324.0 09/27/2021   Lab Results  Component Value Date   NA 136 09/27/2021   K 4.3 09/27/2021   CO2 29 09/27/2021   GLUCOSE 125 (H) 09/27/2021   BUN 13 09/27/2021   CREATININE 0.68 09/27/2021   BILITOT 0.4 09/27/2021   ALKPHOS 66 09/27/2021   AST 12 09/27/2021   ALT 21 09/27/2021   PROT 6.9 09/27/2021   ALBUMIN 4.3 09/27/2021   CALCIUM 9.6 09/27/2021   GFR 104.25 09/27/2021   Lab Results  Component Value Date   CHOL 190 06/29/2021   Lab Results  Component Value Date   HDL 40.70 06/29/2021   Lab Results  Component Value Date   LDLCALC 122 (H) 06/29/2021   Lab Results  Component Value Date   TRIG 136.0 06/29/2021   Lab Results  Component Value Date   CHOLHDL 5 06/29/2021   Lab Results  Component Value Date   HGBA1C 7.2 (H) 09/27/2021      Assessment & Plan:   Problem List Items Addressed This Visit       Endocrine   Diabetes mellitus without complication ([place])    Continue metformin 500 mg once daily Order a1c pending results Work on diabetic diet exercise as tolerated Continue ozempic 0.5 mg weekly       Relevant Orders   Hemoglobin A1c   Adenoma of right adrenal gland    Pending consult with endo         Other   Attention deficit hyperactivity disorder (ADHD)   Pedal edema    New finding Refer to vascular, pitting edema Denies doe, less likely chf however ordering bnp  Also ordering cmp to see kidney function stable Elevate legs throughout day  Walk at least every hour Compression stockings Watch salt intake Pending results of lab consider lasix prn      Relevant Orders   Ambulatory referral to Vascular Surgery   Basic metabolic panel   Brain natriuretic peptide   Elevated lipase    Repeat lipase, then will reach out to GI if still elevated (Dr. Allen Norris)      Relevant Orders    Lipase   Acute pain of right knee - Primary    Xray right knee ordered and pending Wear compression sleeve prn for support Ice/heat Analgesics prn        No orders of the defined types were placed in this encounter.   Follow-up: Return in about 3 months (around 04/15/2022) for regular follow up appt.    Eugenia Pancoast, FNP

## 2022-01-13 NOTE — Assessment & Plan Note (Signed)
New finding Refer to vascular, pitting edema Denies doe, less likely chf however ordering bnp  Also ordering cmp to see kidney function stable Elevate legs throughout day  Walk at least every hour Compression stockings Watch salt intake Pending results of lab consider lasix prn

## 2022-01-13 NOTE — Assessment & Plan Note (Signed)
Xray right knee ordered and pending Wear compression sleeve prn for support Ice/heat Analgesics prn

## 2022-01-14 LAB — BASIC METABOLIC PANEL
BUN: 7 mg/dL (ref 7–25)
CO2: 28 mmol/L (ref 20–32)
Calcium: 9.6 mg/dL (ref 8.6–10.2)
Chloride: 101 mmol/L (ref 98–110)
Creat: 0.64 mg/dL (ref 0.50–0.99)
Glucose, Bld: 100 mg/dL — ABNORMAL HIGH (ref 65–99)
Potassium: 4.7 mmol/L (ref 3.5–5.3)
Sodium: 138 mmol/L (ref 135–146)

## 2022-01-14 LAB — HEMOGLOBIN A1C
Hgb A1c MFr Bld: 6.6 % of total Hgb — ABNORMAL HIGH (ref <5.7)
Mean Plasma Glucose: 143 mg/dL
eAG (mmol/L): 7.9 mmol/L

## 2022-01-14 LAB — LIPASE: Lipase: 51 U/L (ref 7–60)

## 2022-01-14 LAB — BRAIN NATRIURETIC PEPTIDE: Brain Natriuretic Peptide: 19 pg/mL (ref <100)

## 2022-01-16 ENCOUNTER — Other Ambulatory Visit: Payer: Self-pay | Admitting: Family

## 2022-01-16 DIAGNOSIS — M8580 Other specified disorders of bone density and structure, unspecified site: Secondary | ICD-10-CM

## 2022-01-16 DIAGNOSIS — E119 Type 2 diabetes mellitus without complications: Secondary | ICD-10-CM

## 2022-01-16 MED ORDER — METFORMIN HCL ER 500 MG PO TB24: ORAL_TABLET | ORAL | 0 refills | Status: DC

## 2022-01-16 NOTE — Progress Notes (Signed)
Noted osteopenia, ordered dexa.  Degenerative changes in right knee.  Refer to lab results for more details.

## 2022-01-16 NOTE — Progress Notes (Signed)
Hga1c with some improvement from 7.2 to 6.6. keep up the good work! Would you be able to tolerate increase in metformin? If so lets increase to 1000 mg qam and 500 at night x one week, if still doing well then increase to 1000 mg twice daily. Bnp negative. No chf. Lipase back to normal!   Xray knee showed some ostearthritis. So wear sleeve as needed. If still tenderness and or weakness I can send you to orthopedist as well.   There was noted some osteopenia on the xray incidentally so I am  ordering a bone density exam. You can call to schedule this at [facility]: [facility]- enter through medical mall entrance [address], [place], [place] [address]

## 2022-02-28 ENCOUNTER — Ambulatory Visit (INDEPENDENT_AMBULATORY_CARE_PROVIDER_SITE_OTHER): Payer: 59 | Admitting: Nurse Practitioner

## 2022-02-28 ENCOUNTER — Encounter (INDEPENDENT_AMBULATORY_CARE_PROVIDER_SITE_OTHER): Payer: Self-pay | Admitting: Nurse Practitioner

## 2022-02-28 VITALS — BP 156/87 | HR 102 | Resp 16 | Wt 327.6 lb

## 2022-02-28 DIAGNOSIS — E1165 Type 2 diabetes mellitus with hyperglycemia: Secondary | ICD-10-CM | POA: Insufficient documentation

## 2022-02-28 DIAGNOSIS — I10 Essential (primary) hypertension: Secondary | ICD-10-CM | POA: Diagnosis not present

## 2022-02-28 DIAGNOSIS — R600 Localized edema: Secondary | ICD-10-CM | POA: Diagnosis not present

## 2022-02-28 DIAGNOSIS — Z6841 Body Mass Index (BMI) 40.0 and over, adult: Secondary | ICD-10-CM

## 2022-02-28 DIAGNOSIS — K861 Other chronic pancreatitis: Secondary | ICD-10-CM | POA: Insufficient documentation

## 2022-03-02 ENCOUNTER — Ambulatory Visit
Admission: RE | Admit: 2022-03-02 | Discharge: 2022-03-02 | Disposition: A | Discharge status: Home / Self Care | Payer: 59 | Source: Ambulatory Visit | Attending: Family | Admitting: Family

## 2022-03-02 DIAGNOSIS — M8580 Other specified disorders of bone density and structure, unspecified site: Secondary | ICD-10-CM | POA: Insufficient documentation

## 2022-03-14 ENCOUNTER — Other Ambulatory Visit: Payer: Self-pay | Admitting: Family

## 2022-03-14 DIAGNOSIS — E119 Type 2 diabetes mellitus without complications: Secondary | ICD-10-CM

## 2022-03-16 ENCOUNTER — Other Ambulatory Visit: Payer: Self-pay | Admitting: Family

## 2022-03-16 ENCOUNTER — Telehealth: Payer: Self-pay | Admitting: Family

## 2022-03-16 DIAGNOSIS — E119 Type 2 diabetes mellitus without complications: Secondary | ICD-10-CM

## 2022-03-16 NOTE — Telephone Encounter (Signed)
  Encourage patient to contact the pharmacy for refills or they can request refills through [facility]  Did the patient contact the pharmacy: Yes  LAST APPOINTMENT DATE: 01/13/22   NEXT APPOINTMENT DATE: 04/17/22  MEDICATION: metFORMIN (GLUCOPHAGE-XR) 500 MG 24 hr tablet  Is the patient out of medication? No  If not, how much is left? 6 pills  PHARMACY: [facility] ([facility] [place] [facility] [place]  Let patient know to contact pharmacy at the end of the day to make sure medication is ready.  Please notify patient to allow 48-72 hours to process

## 2022-03-17 MED ORDER — METFORMIN HCL ER 500 MG PO TB24: ORAL_TABLET | ORAL | 0 refills | Status: DC

## 2022-03-17 NOTE — Telephone Encounter (Signed)
Refilled and let pt know via mychart.

## 2022-03-17 NOTE — Addendum Note (Signed)
Addended by: Eugenia Pancoast on: 03/17/2022 01:57 PM   Modules accepted: Orders

## 2022-03-19 ENCOUNTER — Encounter (INDEPENDENT_AMBULATORY_CARE_PROVIDER_SITE_OTHER): Payer: Self-pay | Admitting: Nurse Practitioner

## 2022-03-19 NOTE — Progress Notes (Signed)
Subjective:    Patient ID: Brandy Christensen, female    DOB: 1976-05-18, 46 y.o.   MRN: [id number] Chief Complaint  Patient presents with   New Patient (Initial Visit)    Ref D[place] consult pedal edema    The patient is a 46 year old female who presents today for a primary care provider after notable swelling after using Ozempic.  The patient is no longer utilizing this.  She notes the swelling has gone down somewhat but initially it was very substantial.  No open wounds or ulcerations.  No evidence of cellulitis.  The patient has a stable kidney function as well as normal BNP.  Does have a history of chronic pancreatitis.    Review of Systems  Cardiovascular:  Positive for leg swelling.  All other systems reviewed and are negative.      Objective:   Physical Exam Vitals reviewed.  HENT:     Head: Normocephalic.  Cardiovascular:     Rate and Rhythm: Normal rate.     Pulses: Normal pulses.  Pulmonary:     Effort: Pulmonary effort is normal.  Skin:    General: Skin is warm and dry.  Neurological:     Mental Status: She is alert and oriented to person, place, and time.  Psychiatric:        Mood and Affect: Mood normal.        Behavior: Behavior normal.        Thought Content: Thought content normal.        Judgment: Judgment normal.     BP (!) 156/87 (BP Location: Right Arm)   Pulse (!) 102   Resp 16   Wt (!) 327 lb 9.6 oz (148.6 kg)   BMI 58.03 kg/m   Past Medical History:  Diagnosis Date   ADHD (attention deficit hyperactivity disorder)    Asthma    Diabetes mellitus without complication (HCC)     Social History   Socioeconomic History   Marital status: Single    Spouse name: Not on file   Number of children: 0   Years of education: Not on file   Highest education level: Not on file  Occupational History    Employer: [employer]  Tobacco Use   Smoking status: Never   Smokeless tobacco: Never  Vaping Use   Vaping Use: Never used  Substance and  Sexual Activity   Alcohol use: Not Currently   Drug use: Not Currently   Sexual activity: Not Currently    Partners: Female  Other Topics Concern   Not on file  Social History Narrative   Not on file   Social Determinants of Health   Financial Resource Strain: Not on file  Food Insecurity: Not on file  Transportation Needs: Not on file  Physical Activity: Not on file  Stress: Not on file  Social Connections: Not on file  Intimate Partner Violence: Not on file    Past Surgical History:  Procedure Laterality Date   BREAST BIOPSY Right    needle bx neg   COLONOSCOPY WITH PROPOFOL N/A 09/29/2021   Procedure: COLONOSCOPY WITH PROPOFOL;  Surgeon: Lucilla Lame, MD;  Location: [facility] ENDOSCOPY;  Service: Endoscopy;  Laterality: N/A;   thumb surgery      Family History  Problem Relation Age of Onset   Breast cancer Mother 56   Cancer Mother        breast   Crohn's disease Mother    Lymphoma Mother    Learning disabilities Father  Heart disease Father    Hearing loss Maternal Grandmother    Breast cancer Maternal Grandmother    Hearing loss Maternal Grandfather     Allergies  Allergen Reactions   Fluticasone Anaphylaxis    Throat closing, unable to breathe    Levofloxacin Anaphylaxis    Throat closing, unable to breathe    Semaglutide(0.25 Or 0.'5mg'$ -Dos) Other (See Comments)    Severe pancreatitis  Other reaction(s): foot swelling   Trichophyton    Cat Hair Extract     Other reaction(s): Per allergy testing   Dog Epithelium Allergy Skin Test     Other reaction(s): Per allergy testing (dog dander)   Dust Mite Extract     Other reaction(s): Per allergy testing   Gramineae Pollens    Losartan Other (See Comments)    Low diastolic, fainting    Metformin And Related     Metformin regular - GI upset. - mild to moderate.    Other     Other reaction(s): Per allergy testing   Prednisone Other (See Comments)    Paranoia. Also allergic to most steroids        Latest  Ref Rng & Units 09/27/2021    2:52 PM 06/29/2021    9:36 AM 12/23/2020   12:14 PM  CBC  WBC 4.0 - 10.5 K/uL 11.3  9.6  10.6   Hemoglobin 12.0 - 15.0 g/dL 13.2  13.3  13.3   Hematocrit 36.0 - 46.0 % 40.6  41.3  41.2   Platelets 150.0 - 400.0 K/uL 324.0  295.0  307.0       CMP     Component Value Date/Time   NA 138 01/13/2022 1450   K 4.7 01/13/2022 1450   CL 101 01/13/2022 1450   CO2 28 01/13/2022 1450   GLUCOSE 100 (H) 01/13/2022 1450   BUN 7 01/13/2022 1450   CREATININE 0.64 01/13/2022 1450   CALCIUM 9.6 01/13/2022 1450   PROT 6.9 09/27/2021 1452   ALBUMIN 4.3 09/27/2021 1452   AST 12 09/27/2021 1452   ALT 21 09/27/2021 1452   ALKPHOS 66 09/27/2021 1452   BILITOT 0.4 09/27/2021 1452     No results found.     Assessment & Plan:   1. Pedal edema Recommend:  I have had a long discussion with the patient regarding swelling and why it  causes symptoms.  Patient will begin wearing graduated compression on a daily basis a prescription was given. The patient will  wear the stockings first thing in the morning and removing them in the evening. The patient is instructed specifically not to sleep in the stockings.   In addition, behavioral modification will be initiated.  This will include frequent elevation, use of over the counter pain medications and exercise such as walking.  Consideration for a lymph pump will also be made based upon the effectiveness of conservative therapy.  This would help to improve the edema control and prevent sequela such as ulcers and infections   Patient should undergo duplex ultrasound of the venous system to ensure that DVT or reflux is not present.  The patient will follow-up with me after the ultrasound.    2. Benign essential hypertension Continue antihypertensive medications as already ordered, these medications have been reviewed and there are no changes at this time.   3. BMI 60.0-69.9, adult ([place]) Obesity in addition to her  pancreatitis may worsen her lower extremity edema   Current Outpatient Medications on File Prior to Visit  Medication Sig  Dispense Refill   ADDERALL XR 20 MG 24 hr capsule Take 20 mg by mouth every morning.     albuterol (VENTOLIN HFA) 108 (90 Base) MCG/ACT inhaler Inhale 2 puffs into the lungs every 4 (four) hours as needed. 1 each 1   EPINEPHrine 0.3 mg/0.3 mL IJ SOAJ injection INJECT INTO OUTER THIGH FOR SEVERE ALLERGIC REACTION. CALL 911 AFTER USE. MAY SUBSTITUTE ANY BRAND     levocetirizine (XYZAL) 5 MG tablet Take 1 tablet (5 mg total) by mouth every evening. 90 tablet 1   Multiple Vitamin (MULTIVITAMIN) tablet Take 1 tablet by mouth daily.     nortriptyline (PAMELOR) 25 MG capsule SMARTSIG:1 Capsule(s) By Mouth Every Evening 30 capsule 1   SYMBICORT 160-4.5 MCG/ACT inhaler SMARTSIG:2 Puff(s) By Mouth Twice Daily (Patient not taking: Reported on 02/28/2022)     No current facility-administered medications on file prior to visit.    There are no Patient Instructions on file for this visit. No follow-ups on file.   Kris Hartmann, NP

## 2022-04-11 ENCOUNTER — Other Ambulatory Visit (INDEPENDENT_AMBULATORY_CARE_PROVIDER_SITE_OTHER): Payer: Self-pay | Admitting: Nurse Practitioner

## 2022-04-11 DIAGNOSIS — R600 Localized edema: Secondary | ICD-10-CM

## 2022-04-12 ENCOUNTER — Ambulatory Visit (INDEPENDENT_AMBULATORY_CARE_PROVIDER_SITE_OTHER): Payer: 59 | Admitting: Nurse Practitioner

## 2022-04-12 ENCOUNTER — Encounter (INDEPENDENT_AMBULATORY_CARE_PROVIDER_SITE_OTHER): Payer: Self-pay | Admitting: Nurse Practitioner

## 2022-04-12 ENCOUNTER — Ambulatory Visit (INDEPENDENT_AMBULATORY_CARE_PROVIDER_SITE_OTHER): Payer: 59

## 2022-04-12 VITALS — BP 135/82 | HR 97 | Resp 19 | Ht 63.0 in | Wt 325.4 lb

## 2022-04-12 DIAGNOSIS — R600 Localized edema: Secondary | ICD-10-CM

## 2022-04-12 DIAGNOSIS — I10 Essential (primary) hypertension: Secondary | ICD-10-CM | POA: Diagnosis not present

## 2022-04-13 ENCOUNTER — Encounter (INDEPENDENT_AMBULATORY_CARE_PROVIDER_SITE_OTHER): Payer: Self-pay | Admitting: Nurse Practitioner

## 2022-04-13 NOTE — Progress Notes (Signed)
Subjective:    Patient ID: Brandy Christensen, female    DOB: 1976/07/15, 46 y.o.   MRN: [id number] No chief complaint on file.   The patient is a 46 year old female who presents today for a primary care provider after notable swelling after using Ozempic.  The patient is no longer utilizing this.  At the initial office visit, the swelling had gone down somewhat but initially it was very substantial.  No open wounds or ulcerations.  No evidence of cellulitis.  The patient has a stable kidney function as well as normal BNP.  Does have a history of chronic pancreatitis.  Since the previous office visit the patient notes that the swelling is completely gone.  She utilize medical grade compression as well as was able to be somewhat more active.  But she notes that her legs are normal again.  Today noninvasive study showed no evidence of DVT or superficial thrombophlebitis.  No evidence of superficial venous reflux or deep venous insufficiency.   Review of Systems  Cardiovascular:  Positive for leg swelling.  All other systems reviewed and are negative.      Objective:   Physical Exam  BP 135/82 (BP Location: Left Arm)   Pulse 97   Resp 19   Ht '5\' 3"'$  (1.6 m)   Wt (!) 325 lb 6.4 oz (147.6 kg)   BMI 57.64 kg/m   Past Medical History:  Diagnosis Date   ADHD (attention deficit hyperactivity disorder)    Asthma    Diabetes mellitus without complication (HCC)     Social History   Socioeconomic History   Marital status: Single    Spouse name: Not on file   Number of children: 0   Years of education: Not on file   Highest education level: Not on file  Occupational History    Employer: [employer]  Tobacco Use   Smoking status: Never   Smokeless tobacco: Never  Vaping Use   Vaping Use: Never used  Substance and Sexual Activity   Alcohol use: Not Currently   Drug use: Not Currently   Sexual activity: Not Currently    Partners: Female  Other Topics Concern   Not on file   Social History Narrative   Not on file   Social Determinants of Health   Financial Resource Strain: Not on file  Food Insecurity: Not on file  Transportation Needs: Not on file  Physical Activity: Not on file  Stress: Not on file  Social Connections: Not on file  Intimate Partner Violence: Not on file    Past Surgical History:  Procedure Laterality Date   BREAST BIOPSY Right    needle bx neg   COLONOSCOPY WITH PROPOFOL N/A 09/29/2021   Procedure: COLONOSCOPY WITH PROPOFOL;  Surgeon: Lucilla Lame, MD;  Location: [facility] ENDOSCOPY;  Service: Endoscopy;  Laterality: N/A;   thumb surgery      Family History  Problem Relation Age of Onset   Breast cancer Mother 88   Cancer Mother        breast   Crohn's disease Mother    Lymphoma Mother    Learning disabilities Father    Heart disease Father    Hearing loss Maternal Grandmother    Breast cancer Maternal Grandmother    Hearing loss Maternal Grandfather     Allergies  Allergen Reactions   Fluticasone Anaphylaxis    Throat closing, unable to breathe    Levofloxacin Anaphylaxis    Throat closing, unable to breathe  Semaglutide(0.25 Or 0.'5mg'$ -Dos) Other (See Comments)    Severe pancreatitis  Other reaction(s): foot swelling   Trichophyton    Cat Hair Extract     Other reaction(s): Per allergy testing   Dog Epithelium Allergy Skin Test     Other reaction(s): Per allergy testing (dog dander)   Dust Mite Extract     Other reaction(s): Per allergy testing   Gramineae Pollens    Losartan Other (See Comments)    Low diastolic, fainting    Metformin And Related     Metformin regular - GI upset. - mild to moderate.    Other     Other reaction(s): Per allergy testing   Prednisone Other (See Comments)    Paranoia. Also allergic to most steroids        Latest Ref Rng & Units 09/27/2021    2:52 PM 06/29/2021    9:36 AM 12/23/2020   12:14 PM  CBC  WBC 4.0 - 10.5 K/uL 11.3  9.6  10.6   Hemoglobin 12.0 - 15.0 g/dL 13.2  13.3   13.3   Hematocrit 36.0 - 46.0 % 40.6  41.3  41.2   Platelets 150.0 - 400.0 K/uL 324.0  295.0  307.0       CMP     Component Value Date/Time   NA 138 01/13/2022 1450   K 4.7 01/13/2022 1450   CL 101 01/13/2022 1450   CO2 28 01/13/2022 1450   GLUCOSE 100 (H) 01/13/2022 1450   BUN 7 01/13/2022 1450   CREATININE 0.64 01/13/2022 1450   CALCIUM 9.6 01/13/2022 1450   PROT 6.9 09/27/2021 1452   ALBUMIN 4.3 09/27/2021 1452   AST 12 09/27/2021 1452   ALT 21 09/27/2021 1452   ALKPHOS 66 09/27/2021 1452   BILITOT 0.4 09/27/2021 1452     No results found.     Assessment & Plan:   1. Pedal edema Edema has resolved.  Patient advised to continue with activity as well as to continue with other conservative tactics such as use of medical grade compression and elevation.  Patient will follow-up with [place] on an as-needed basis.  2. Benign essential hypertension Continue antihypertensive medications as already ordered, these medications have been reviewed and there are no changes at this time.    Current Outpatient Medications on File Prior to Visit  Medication Sig Dispense Refill   ADDERALL XR 20 MG 24 hr capsule Take 20 mg by mouth every morning.     EPINEPHrine 0.3 mg/0.3 mL IJ SOAJ injection INJECT INTO OUTER THIGH FOR SEVERE ALLERGIC REACTION. CALL 911 AFTER USE. MAY SUBSTITUTE ANY BRAND     levocetirizine (XYZAL) 5 MG tablet Take 1 tablet (5 mg total) by mouth every evening. 90 tablet 1   metFORMIN (GLUCOPHAGE-XR) 500 MG 24 hr tablet Take two tablets po twice daily 180 tablet 0   Multiple Vitamin (MULTIVITAMIN) tablet Take 1 tablet by mouth daily.     nortriptyline (PAMELOR) 25 MG capsule SMARTSIG:1 Capsule(s) By Mouth Every Evening 30 capsule 1   albuterol (VENTOLIN HFA) 108 (90 Base) MCG/ACT inhaler Inhale 2 puffs into the lungs every 4 (four) hours as needed. 1 each 1   No current facility-administered medications on file prior to visit.    There are no Patient Instructions on  file for this visit. No follow-ups on file.   Kris Hartmann, NP

## 2022-04-17 ENCOUNTER — Ambulatory Visit (INDEPENDENT_AMBULATORY_CARE_PROVIDER_SITE_OTHER): Payer: 59 | Admitting: Family

## 2022-04-17 ENCOUNTER — Encounter: Payer: Self-pay | Admitting: Family

## 2022-04-17 VITALS — BP 135/82 | HR 92 | Temp 98.6°F | Resp 16 | Ht 63.0 in | Wt 329.0 lb

## 2022-04-17 DIAGNOSIS — Z8719 Personal history of other diseases of the digestive system: Secondary | ICD-10-CM | POA: Insufficient documentation

## 2022-04-17 DIAGNOSIS — Z1231 Encounter for screening mammogram for malignant neoplasm of breast: Secondary | ICD-10-CM | POA: Diagnosis not present

## 2022-04-17 DIAGNOSIS — E119 Type 2 diabetes mellitus without complications: Secondary | ICD-10-CM

## 2022-04-17 DIAGNOSIS — J4530 Mild persistent asthma, uncomplicated: Secondary | ICD-10-CM

## 2022-04-17 DIAGNOSIS — M25561 Pain in right knee: Secondary | ICD-10-CM | POA: Diagnosis not present

## 2022-04-17 DIAGNOSIS — E1165 Type 2 diabetes mellitus with hyperglycemia: Secondary | ICD-10-CM

## 2022-04-17 DIAGNOSIS — I10 Essential (primary) hypertension: Secondary | ICD-10-CM

## 2022-04-17 DIAGNOSIS — R600 Localized edema: Secondary | ICD-10-CM

## 2022-04-17 DIAGNOSIS — I152 Hypertension secondary to endocrine disorders: Secondary | ICD-10-CM

## 2022-04-17 DIAGNOSIS — E1159 Type 2 diabetes mellitus with other circulatory complications: Secondary | ICD-10-CM

## 2022-04-17 LAB — POCT GLYCOSYLATED HEMOGLOBIN (HGB A1C): Hemoglobin A1C: 6.4 % — AB (ref 4.0–5.6)

## 2022-04-17 MED ORDER — HYDROCHLOROTHIAZIDE 25 MG PO TABS: 25.0000 mg | ORAL_TABLET | Freq: Every day | ORAL | 3 refills | Status: DC

## 2022-04-17 MED ORDER — METFORMIN HCL ER 500 MG PO TB24: ORAL_TABLET | ORAL | 5 refills | Status: DC

## 2022-04-17 NOTE — Assessment & Plan Note (Signed)
a1c today in office, improving.  Continue metformin 500 mg XR two tablets twice daily Continue diabetic diet exercise as tolerated

## 2022-04-17 NOTE — Progress Notes (Signed)
Established Patient Office Visit  Subjective:  Patient ID: Brandy Christensen, female    DOB: 1976-03-06  Age: 46 y.o. MRN: [id number]  CC:  Chief Complaint  Patient presents with   Diabetes    HPI Brandy Christensen is here today for follow up.   Wt Readings from Last 3 Encounters:  04/17/22 (!) 329 lb (149.2 kg)  04/12/22 (!) 325 lb 6.4 oz (147.6 kg)  02/28/22 (!) 327 lb 9.6 oz (148.6 kg)    DM2: metformin 500 mg XR , two tablets po twice daily and on ozempic 0.5 mg weekly however d/c ozempic as was causing pedal edema. Seen by vascular Dr. Owens Shark for eval pedal edema, but noninvasive study with no evidence DVT or superficial thrombophlebitis. No DVT.   Lab Results  Component Value Date   HGBA1C 6.4 (A) 04/17/2022   Psychiatrist: seeing them every three months, doing well , they are filling her pamelor as well as adderall XR  Adenoma of right adrenal gland: referred to endo.   Seen two months ago by endo, was given dexamethasone suppression test, and was negative for cushings. No longer with fatigue. Some improvement in right knee as well, pain at times inner aspect of right knee. She is thinking maybe needs referral to orthopedist.   Past Medical History:  Diagnosis Date   ADHD (attention deficit hyperactivity disorder)    Asthma    Diabetes mellitus without complication ([place])     Past Surgical History:  Procedure Laterality Date   BREAST BIOPSY Right    needle bx neg   COLONOSCOPY WITH PROPOFOL N/A 09/29/2021   Procedure: COLONOSCOPY WITH PROPOFOL;  Surgeon: Lucilla Lame, MD;  Location: [facility] ENDOSCOPY;  Service: Endoscopy;  Laterality: N/A;   thumb surgery      Family History  Problem Relation Age of Onset   Breast cancer Mother 89   Cancer Mother        breast   Crohn's disease Mother    Lymphoma Mother    Learning disabilities Father    Heart disease Father    Hearing loss Maternal Grandmother    Breast cancer Maternal Grandmother    Hearing loss Maternal  Grandfather     Social History   Socioeconomic History   Marital status: Single    Spouse name: Not on file   Number of children: 0   Years of education: Not on file   Highest education level: Not on file  Occupational History    Employer: [employer]  Tobacco Use   Smoking status: Never   Smokeless tobacco: Never  Vaping Use   Vaping Use: Never used  Substance and Sexual Activity   Alcohol use: Not Currently   Drug use: Not Currently   Sexual activity: Not Currently    Partners: Female  Other Topics Concern   Not on file  Social History Narrative   Not on file   Social Determinants of Health   Financial Resource Strain: Not on file  Food Insecurity: Not on file  Transportation Needs: Not on file  Physical Activity: Not on file  Stress: Not on file  Social Connections: Not on file  Intimate Partner Violence: Not on file    Outpatient Medications Prior to Visit  Medication Sig Dispense Refill   ADDERALL XR 20 MG 24 hr capsule Take 20 mg by mouth every morning.     EPINEPHrine 0.3 mg/0.3 mL IJ SOAJ injection INJECT INTO OUTER THIGH FOR SEVERE ALLERGIC REACTION. CALL 911 AFTER USE. MAY  SUBSTITUTE ANY BRAND     levocetirizine (XYZAL) 5 MG tablet Take 1 tablet (5 mg total) by mouth every evening. 90 tablet 1   Multiple Vitamin (MULTIVITAMIN) tablet Take 1 tablet by mouth daily.     nortriptyline (PAMELOR) 25 MG capsule SMARTSIG:1 Capsule(s) By Mouth Every Evening 30 capsule 1   metFORMIN (GLUCOPHAGE-XR) 500 MG 24 hr tablet Take two tablets po twice daily 180 tablet 0   albuterol (VENTOLIN HFA) 108 (90 Base) MCG/ACT inhaler Inhale 2 puffs into the lungs every 4 (four) hours as needed. 1 each 1   No facility-administered medications prior to visit.    Allergies  Allergen Reactions   Fluticasone Anaphylaxis    Throat closing, unable to breathe    Levofloxacin Anaphylaxis    Throat closing, unable to breathe    Semaglutide(0.25 Or 0.'5mg'$ -Dos) Other (See Comments)     Severe pancreatitis  Other reaction(s): foot swelling   Trichophyton    Cat Hair Extract     Other reaction(s): Per allergy testing   Dog Epithelium Allergy Skin Test     Other reaction(s): Per allergy testing (dog dander)   Dust Mite Extract     Other reaction(s): Per allergy testing   Gramineae Pollens    Losartan Other (See Comments)    Low diastolic, fainting    Metformin And Related     Metformin regular - GI upset. - mild to moderate.    Other     Other reaction(s): Per allergy testing   Prednisone Other (See Comments)    Paranoia. Also allergic to most steroids         Objective:    Physical Exam Constitutional:      General: She is not in acute distress.    Appearance: Normal appearance. She is obese. She is not ill-appearing, toxic-appearing or diaphoretic.  Cardiovascular:     Rate and Rhythm: Normal rate and regular rhythm.  Pulmonary:     Effort: Pulmonary effort is normal.     Breath sounds: Normal breath sounds.  Musculoskeletal:     Right lower leg: 1+ Pitting Edema present.     Left lower leg: 1+ Pitting Edema present.  Neurological:     General: No focal deficit present.     Mental Status: She is alert and oriented to person, place, and time. Mental status is at baseline.  Psychiatric:        Mood and Affect: Mood normal.        Behavior: Behavior normal.        Thought Content: Thought content normal.        Judgment: Judgment normal.       BP 135/82   Pulse 92   Temp 98.6 F (37 C)   Resp 16   Ht '5\' 3"'$  (1.6 m)   Wt (!) 329 lb (149.2 kg)   LMP 03/21/2022 (Approximate)   SpO2 98%   BMI 58.28 kg/m  Wt Readings from Last 3 Encounters:  04/17/22 (!) 329 lb (149.2 kg)  04/12/22 (!) 325 lb 6.4 oz (147.6 kg)  02/28/22 (!) 327 lb 9.6 oz (148.6 kg)     Health Maintenance Due  Topic Date Due   FOOT EXAM  Never done   OPHTHALMOLOGY EXAM  Never done   Diabetic kidney evaluation - Urine ACR  01/19/2022   INFLUENZA VACCINE  02/14/2022     There are no preventive care reminders to display for this patient.  Lab Results  Component Value Date  TSH 2.00 06/29/2021   Lab Results  Component Value Date   WBC 11.3 (H) 09/27/2021   HGB 13.2 09/27/2021   HCT 40.6 09/27/2021   MCV 82.7 09/27/2021   PLT 324.0 09/27/2021   Lab Results  Component Value Date   NA 138 01/13/2022   K 4.7 01/13/2022   CO2 28 01/13/2022   GLUCOSE 100 (H) 01/13/2022   BUN 7 01/13/2022   CREATININE 0.64 01/13/2022   BILITOT 0.4 09/27/2021   ALKPHOS 66 09/27/2021   AST 12 09/27/2021   ALT 21 09/27/2021   PROT 6.9 09/27/2021   ALBUMIN 4.3 09/27/2021   CALCIUM 9.6 01/13/2022   GFR 104.25 09/27/2021   Lab Results  Component Value Date   CHOL 190 06/29/2021   Lab Results  Component Value Date   HDL 40.70 06/29/2021   Lab Results  Component Value Date   LDLCALC 122 (H) 06/29/2021   Lab Results  Component Value Date   TRIG 136.0 06/29/2021   Lab Results  Component Value Date   CHOLHDL 5 06/29/2021   Lab Results  Component Value Date   HGBA1C 6.4 (A) 04/17/2022      Assessment & Plan:   Problem List Items Addressed This Visit       Cardiovascular and Mediastinum   Hypertension associated with diabetes ([place])    Start hctz 25 mg as they may also help with pedal edema. F/u three weeks       Relevant Medications   metFORMIN (GLUCOPHAGE-XR) 500 MG 24 hr tablet   hydrochlorothiazide (HYDRODIURIL) 25 MG tablet   Primary hypertension   Relevant Medications   hydrochlorothiazide (HYDRODIURIL) 25 MG tablet     Respiratory   Mild persistent asthma     Endocrine   Hyperglycemia due to type 2 diabetes mellitus ([place])    a1c today in office, improving.  Continue metformin 500 mg XR two tablets twice daily Continue diabetic diet exercise as tolerated      Relevant Medications   metFORMIN (GLUCOPHAGE-XR) 500 MG 24 hr tablet   RESOLVED: Diabetes mellitus without complication (HCC)   Relevant Medications   metFORMIN  (GLUCOPHAGE-XR) 500 MG 24 hr tablet   Other Relevant Orders   POCT glycosylated hemoglobin (Hb A1C) (Completed)     Other   History of gastric polyp   Acute pain of right knee - Primary    Ongoing pain.  Heat/ice to site Compression sleeve as able Sending to orthopedist referral       Relevant Orders   AMB referral to orthopedics   Other Visit Diagnoses     Encounter for screening mammogram for malignant neoplasm of breast       Relevant Orders   MM DIAG BREAST TOMO BILATERAL   Pedal edema       Relevant Medications   hydrochlorothiazide (HYDRODIURIL) 25 MG tablet       Meds ordered this encounter  Medications   metFORMIN (GLUCOPHAGE-XR) 500 MG 24 hr tablet    Sig: Take two tablets po bid    Dispense:  120 tablet    Refill:  5    Order Specific Question:   Supervising Provider    Answer:   BEDSOLE, AMY E [[id number]]   hydrochlorothiazide (HYDRODIURIL) 25 MG tablet    Sig: Take 1 tablet (25 mg total) by mouth daily.    Dispense:  90 tablet    Refill:  3    Order Specific Question:   Supervising Provider    Answer:  BEDSOLE, AMY E [[id number]]    Follow-up: Return in about 3 weeks (around 05/08/2022) for f/u blood pressure.    Eugenia Pancoast, FNP

## 2022-04-17 NOTE — Patient Instructions (Addendum)
Start hctz to see if this helps with blood pressure and your swelling in your legs.   Start monitoring your blood pressure daily, around the same time of day, for the next 2-3 weeks.  Ensure that you have rested for 30 minutes prior to checking your blood pressure. Record your readings and bring them to your next visit.  Regards,   Eugenia Pancoast FNP-C

## 2022-04-17 NOTE — Assessment & Plan Note (Signed)
Ongoing pain.  Heat/ice to site Compression sleeve as able Sending to orthopedist referral

## 2022-04-17 NOTE — Assessment & Plan Note (Signed)
Start hctz 25 mg as they may also help with pedal edema. F/u three weeks

## 2022-04-20 ENCOUNTER — Ambulatory Visit (INDEPENDENT_AMBULATORY_CARE_PROVIDER_SITE_OTHER): Payer: 59 | Admitting: Orthopaedic Surgery

## 2022-04-20 ENCOUNTER — Encounter: Payer: Self-pay | Admitting: Orthopaedic Surgery

## 2022-04-20 DIAGNOSIS — M1711 Unilateral primary osteoarthritis, right knee: Secondary | ICD-10-CM | POA: Insufficient documentation

## 2022-04-20 DIAGNOSIS — M25561 Pain in right knee: Secondary | ICD-10-CM | POA: Diagnosis not present

## 2022-04-20 NOTE — Progress Notes (Signed)
Office Visit Note   Patient: Brandy Christensen           Date of Birth: Dec 07, 1975           MRN: [id number] Visit Date: 04/20/2022              Requested by: Brandy Christensen, [place],  [place] [id number] PCP: Brandy Pancoast, FNP   Assessment & Plan: Visit Diagnoses:  1. Acute pain of right knee   2. Unilateral primary osteoarthritis, right knee     Plan: [name] is a pleasant 46 year old woman with a 19-monthhistory of right knee pain.  She denies any particular injury though noticed it after she had been on the treadmill and mowing the lawn.  Denies any twisting type injury.  Denies any significant injury in the past.  She has been taking lysine and applying Voltaren gel intermittently which she thinks helps.  The pain is not as significant as it had been in the past.  She denies any locking or instability.  X-rays from the end of June demonstrate tricompartmental arthritis with sclerotic changes and joint space narrowing most noted in the medial compartment with periarticular osteophytes.  Explained that most of her problem is secondary to arthritis.  Discussed going back to the gym and swimming which she did before the pandemic.  This would probably be better for her than walking.  Of also given her some quadricep strengthening exercises.  She could consider putting Voltaren on 3 times a day on a regular basis.  Ultimately at some point in her life she may not have relief and may need a knee replacement.  She understands the parameters for this.  We also considered a cortisone injection but she has significant reactions to steroids injected intramuscularly and orally that made her extremely paranoid and would like to avoid this.  In the future she could be a good candidate for viscosupplementation  Follow-Up Instructions: Return if symptoms worsen or fail to improve.   Orders:  No orders of the defined types were placed in this encounter.  No orders of the defined  types were placed in this encounter.     Procedures: No procedures performed   Clinical Data: No additional findings.   Subjective: Chief Complaint  Patient presents with   Right Knee - Pain  Patient presents today for right knee pain. She said that it started 3 months ago while walking on the treadmill. No known injury. Her pain is located medially and laterally. She said that it does swell. No grinding, catching, or giving way. She takes tylenol for pain relief. She does not takes NSAIDS. She is diabetic. She had prior x-rays before today, but no treatment.     Review of Systems  All other systems reviewed and are negative.    Objective: Vital Signs: LMP 03/21/2022 (Approximate)   Physical Exam Constitutional:      Appearance: Normal appearance.  Pulmonary:     Effort: Pulmonary effort is normal.  Skin:    General: Skin is warm and dry.  Neurological:     General: No focal deficit present.     Mental Status: She is alert.     Ortho Exam Examination of her right knee she has no warmth no effusion no erythema.  She has good varus and valgus stability.  She is able to sustain a straight leg raise.  She does have some varus alignment is tender over her medial joint.  She does have full looks extension of her knee without difficulty.  Distal compartments are soft and nontender.  She is neurologically intact Specialty Comments:  No specialty comments available.  Imaging: No results found.   PMFS History: Patient Active Problem List   Diagnosis Date Noted   Unilateral primary osteoarthritis, right knee 04/20/2022   History of gastric polyp 04/17/2022   Acute pain of right knee 04/17/2022   Primary hypertension 04/17/2022   Benign essential hypertension 02/28/2022   Hyperglycemia due to type 2 diabetes mellitus ([place]) 02/28/2022   Adenoma of right adrenal gland 10/13/2021   Psoriasis 10/13/2021   Hyperlipidemia 09/27/2021   Morbid obesity with BMI of 60.0-69.9,  adult ([place]) 09/27/2021   Seasonal allergies 09/27/2021   Hypertension associated with diabetes ([place]) 01/19/2021   Attention deficit hyperactivity disorder (ADHD) 12/23/2020   Mild persistent asthma 12/23/2020   Vitamin D deficiency 12/23/2020   History of chronic pancreatitis 12/23/2020   Past Medical History:  Diagnosis Date   ADHD (attention deficit hyperactivity disorder)    Asthma    Diabetes mellitus without complication ([place])     Family History  Problem Relation Age of Onset   Breast cancer Mother 31   Cancer Mother        breast   Crohn's disease Mother    Lymphoma Mother    Learning disabilities Father    Heart disease Father    Hearing loss Maternal Grandmother    Breast cancer Maternal Grandmother    Hearing loss Maternal Grandfather     Past Surgical History:  Procedure Laterality Date   BREAST BIOPSY Right    needle bx neg   COLONOSCOPY WITH PROPOFOL N/A 09/29/2021   Procedure: COLONOSCOPY WITH PROPOFOL;  Surgeon: Lucilla Lame, MD;  Location: [facility] ENDOSCOPY;  Service: Endoscopy;  Laterality: N/A;   thumb surgery     Social History   Occupational History    Employer: [employer]  Tobacco Use   Smoking status: Never   Smokeless tobacco: Never  Vaping Use   Vaping Use: Never used  Substance and Sexual Activity   Alcohol use: Not Currently   Drug use: Not Currently   Sexual activity: Not Currently    Partners: Female

## 2022-05-08 ENCOUNTER — Encounter: Payer: Self-pay | Admitting: Family

## 2022-05-08 ENCOUNTER — Ambulatory Visit (INDEPENDENT_AMBULATORY_CARE_PROVIDER_SITE_OTHER): Payer: 59 | Admitting: Family

## 2022-05-08 VITALS — BP 146/74 | HR 98 | Temp 98.6°F | Resp 16 | Ht 63.0 in | Wt 324.0 lb

## 2022-05-08 DIAGNOSIS — J4530 Mild persistent asthma, uncomplicated: Secondary | ICD-10-CM | POA: Diagnosis not present

## 2022-05-08 DIAGNOSIS — E782 Mixed hyperlipidemia: Secondary | ICD-10-CM

## 2022-05-08 DIAGNOSIS — E1165 Type 2 diabetes mellitus with hyperglycemia: Secondary | ICD-10-CM

## 2022-05-08 DIAGNOSIS — I10 Essential (primary) hypertension: Secondary | ICD-10-CM | POA: Diagnosis not present

## 2022-05-08 DIAGNOSIS — R600 Localized edema: Secondary | ICD-10-CM

## 2022-05-08 LAB — BASIC METABOLIC PANEL
BUN: 11 mg/dL (ref 6–23)
CO2: 30 mEq/L (ref 19–32)
Calcium: 9.6 mg/dL (ref 8.4–10.5)
Chloride: 95 mEq/L — ABNORMAL LOW (ref 96–112)
Creatinine, Ser: 0.65 mg/dL (ref 0.40–1.20)
GFR: 104.94 mL/min (ref >60.00)
Glucose, Bld: 116 mg/dL — ABNORMAL HIGH (ref 70–99)
Potassium: 4.1 mEq/L (ref 3.5–5.1)
Sodium: 134 mEq/L — ABNORMAL LOW (ref 135–145)

## 2022-05-08 LAB — LIPID PANEL
Cholesterol: 172 mg/dL (ref 0–200)
HDL: 36.2 mg/dL — ABNORMAL LOW (ref >39.00)
NonHDL: 135.95
Total CHOL/HDL Ratio: 5
Triglycerides: 217 mg/dL — ABNORMAL HIGH (ref 0.0–149.0)
VLDL: 43.4 mg/dL — ABNORMAL HIGH (ref 0.0–40.0)

## 2022-05-08 LAB — LDL CHOLESTEROL, DIRECT: Direct LDL: 112 mg/dL

## 2022-05-08 MED ORDER — ALBUTEROL SULFATE HFA 108 (90 BASE) MCG/ACT IN AERS: 2.0000 | INHALATION_SPRAY | RESPIRATORY_TRACT | 1 refills | Status: DC | PRN

## 2022-05-08 MED ORDER — AMLODIPINE BESYLATE 5 MG PO TABS: 5.0000 mg | ORAL_TABLET | Freq: Every day | ORAL | 1 refills | Status: DC

## 2022-05-08 NOTE — Progress Notes (Signed)
Established Patient Office Visit  Subjective:  Patient ID: Brandy Christensen, female    DOB: April 15, 1976  Age: 46 y.o. MRN: [id number]  CC:  Chief Complaint  Patient presents with   Hypertension    HPI Brandy Christensen is here today for follow up.   HTN: has checked blood pressure over the last week and stays consistion with >130/90. Highest was 158/80 lowest was 131/66.   Pedal edema: taking hctz 25 mg once daily has seen improvement with her pedal edema but not necessary her blood pressure.   DM2:  Lab Results  Component Value Date   HGBA1C 6.4 (A) 04/17/2022     Past Medical History:  Diagnosis Date   ADHD (attention deficit hyperactivity disorder)    Asthma    Diabetes mellitus without complication ([place])     Past Surgical History:  Procedure Laterality Date   BREAST BIOPSY Right    needle bx neg   COLONOSCOPY WITH PROPOFOL N/A 09/29/2021   Procedure: COLONOSCOPY WITH PROPOFOL;  Surgeon: Lucilla Lame, MD;  Location: [facility] ENDOSCOPY;  Service: Endoscopy;  Laterality: N/A;   thumb surgery      Family History  Problem Relation Age of Onset   Breast cancer Mother 72   Cancer Mother        breast   Crohn's disease Mother    Lymphoma Mother    Learning disabilities Father    Heart disease Father    Hearing loss Maternal Grandmother    Breast cancer Maternal Grandmother    Hearing loss Maternal Grandfather     Social History   Socioeconomic History   Marital status: Single    Spouse name: Not on file   Number of children: 0   Years of education: Not on file   Highest education level: Not on file  Occupational History    Employer: [employer]  Tobacco Use   Smoking status: Never   Smokeless tobacco: Never  Vaping Use   Vaping Use: Never used  Substance and Sexual Activity   Alcohol use: Not Currently   Drug use: Not Currently   Sexual activity: Not Currently    Partners: Female  Other Topics Concern   Not on file  Social History Narrative   Not on  file   Social Determinants of Health   Financial Resource Strain: Not on file  Food Insecurity: Not on file  Transportation Needs: Not on file  Physical Activity: Not on file  Stress: Not on file  Social Connections: Not on file  Intimate Partner Violence: Not on file    Outpatient Medications Prior to Visit  Medication Sig Dispense Refill   ADDERALL XR 20 MG 24 hr capsule Take 20 mg by mouth every morning.     EPINEPHrine 0.3 mg/0.3 mL IJ SOAJ injection INJECT INTO OUTER THIGH FOR SEVERE ALLERGIC REACTION. CALL 911 AFTER USE. MAY SUBSTITUTE ANY BRAND     hydrochlorothiazide (HYDRODIURIL) 25 MG tablet Take 1 tablet (25 mg total) by mouth daily. 90 tablet 3   levocetirizine (XYZAL) 5 MG tablet Take 1 tablet (5 mg total) by mouth every evening. 90 tablet 1   metFORMIN (GLUCOPHAGE-XR) 500 MG 24 hr tablet Take two tablets po bid 120 tablet 5   Multiple Vitamin (MULTIVITAMIN) tablet Take 1 tablet by mouth daily.     nortriptyline (PAMELOR) 25 MG capsule SMARTSIG:1 Capsule(s) By Mouth Every Evening 30 capsule 1   albuterol (VENTOLIN HFA) 108 (90 Base) MCG/ACT inhaler Inhale 2 puffs into the lungs every  4 (four) hours as needed. 1 each 1   No facility-administered medications prior to visit.    Allergies  Allergen Reactions   Fluticasone Anaphylaxis    Throat closing, unable to breathe    Levofloxacin Anaphylaxis    Throat closing, unable to breathe    Semaglutide(0.25 Or 0.'5mg'$ -Dos) Other (See Comments)    Severe pancreatitis  Other reaction(s): foot swelling   Trichophyton    Cat Hair Extract     Other reaction(s): Per allergy testing   Dog Epithelium Allergy Skin Test     Other reaction(s): Per allergy testing (dog dander)   Dust Mite Extract     Other reaction(s): Per allergy testing   Gramineae Pollens    Losartan Other (See Comments)    Low diastolic, fainting    Metformin And Related     Metformin regular - GI upset. - mild to moderate.    Other     Other reaction(s):  Per allergy testing   Prednisone Other (See Comments)    Paranoia. Also allergic to most steroids           Objective:    Physical Exam Constitutional:      Appearance: Normal appearance. She is obese.  Cardiovascular:     Rate and Rhythm: Normal rate and regular rhythm.  Pulmonary:     Effort: Pulmonary effort is normal.     Breath sounds: Normal breath sounds.  Musculoskeletal:     Right lower leg: 2+ Pitting Edema present.     Left lower leg: 2+ Pitting Edema present.  Neurological:     General: No focal deficit present.     Mental Status: She is alert and oriented to person, place, and time. Mental status is at baseline.  Psychiatric:        Mood and Affect: Mood normal.        Behavior: Behavior normal.        Thought Content: Thought content normal.        Judgment: Judgment normal.      BP (!) 146/74   Pulse 98   Temp 98.6 F (37 C)   Resp 16   Ht '5\' 3"'$  (1.6 m)   Wt (!) 324 lb (147 kg)   LMP 04/18/2022 (Approximate)   SpO2 99%   BMI 57.39 kg/m  Wt Readings from Last 3 Encounters:  05/08/22 (!) 324 lb (147 kg)  04/17/22 (!) 329 lb (149.2 kg)  04/12/22 (!) 325 lb 6.4 oz (147.6 kg)     Health Maintenance Due  Topic Date Due   FOOT EXAM  Never done   OPHTHALMOLOGY EXAM  Never done   Diabetic kidney evaluation - Urine ACR  01/19/2022    There are no preventive care reminders to display for this patient.  Lab Results  Component Value Date   TSH 2.00 06/29/2021   Lab Results  Component Value Date   WBC 11.3 (H) 09/27/2021   HGB 13.2 09/27/2021   HCT 40.6 09/27/2021   MCV 82.7 09/27/2021   PLT 324.0 09/27/2021   Lab Results  Component Value Date   NA 134 (L) 05/08/2022   K 4.1 05/08/2022   CO2 30 05/08/2022   GLUCOSE 116 (H) 05/08/2022   BUN 11 05/08/2022   CREATININE 0.65 05/08/2022   BILITOT 0.4 09/27/2021   ALKPHOS 66 09/27/2021   AST 12 09/27/2021   ALT 21 09/27/2021   PROT 6.9 09/27/2021   ALBUMIN 4.3 09/27/2021   CALCIUM 9.6  05/08/2022  GFR 104.94 05/08/2022   Lab Results  Component Value Date   CHOL 172 05/08/2022   Lab Results  Component Value Date   HDL 36.20 (L) 05/08/2022   Lab Results  Component Value Date   LDLCALC 122 (H) 06/29/2021   Lab Results  Component Value Date   TRIG 217.0 (H) 05/08/2022   Lab Results  Component Value Date   CHOLHDL 5 05/08/2022   Lab Results  Component Value Date   HGBA1C 6.4 (A) 04/17/2022      Assessment & Plan:   Problem List Items Addressed This Visit       Cardiovascular and Mediastinum   Primary hypertension - Primary    Start amlodipine 5 mg once daily Pt advised of the following:  Continue medication as prescribed. Monitor blood pressure periodically and/or when you feel symptomatic. Goal is <130/90 on average. Ensure that you have rested for 30 minutes prior to checking your blood pressure. Record your readings and bring them to your next visit if necessary.work on a low sodium diet.       Relevant Medications   amLODipine (NORVASC) 5 MG tablet   Other Relevant Orders   Basic metabolic panel (Completed)     Respiratory   Mild persistent asthma   Relevant Medications   albuterol (VENTOLIN HFA) 108 (90 Base) MCG/ACT inhaler     Endocrine   Hyperglycemia due to type 2 diabetes mellitus (H[facility])    Continue metformin 500 mg         Other   Hyperlipidemia   Relevant Medications   amLODipine (NORVASC) 5 MG tablet   Other Relevant Orders   Lipid panel (Completed)   Pedal edema    Continue hctz 25 mg  As improvement with this medication Advised pt to elevate legs, and watch sodium intake daily. Advised to wear compression stockings as well.       Relevant Orders   Basic metabolic panel (Completed)    Meds ordered this encounter  Medications   DISCONTD: albuterol (VENTOLIN HFA) 108 (90 Base) MCG/ACT inhaler    Sig: Inhale 2 puffs into the lungs every 4 (four) hours as needed.    Dispense:  1 each    Refill:  1   albuterol  (VENTOLIN HFA) 108 (90 Base) MCG/ACT inhaler    Sig: Inhale 2 puffs into the lungs every 4 (four) hours as needed.    Dispense:  1 each    Refill:  1    Order Specific Question:   Supervising Provider    Answer:   BEDSOLE, AMY E [[id number]]   amLODipine (NORVASC) 5 MG tablet    Sig: Take 1 tablet (5 mg total) by mouth daily.    Dispense:  90 tablet    Refill:  1    Order Specific Question:   Supervising Provider    Answer:   Diona Browner, AMY E [[id number]]    Follow-up: Return in about 1 month (around 06/08/2022) for f/u blood pressure.    Eugenia Pancoast, FNP

## 2022-05-08 NOTE — Patient Instructions (Addendum)
Wear compression stockings at night, watch salt in your diet.  Elevate legs at night time above heart.   Start amlodipine to 5 mg once daily.  Start monitoring your blood pressure daily, around the same time of day, for the next 2-3 weeks.  Ensure that you have rested for 30 minutes prior to checking your blood pressure. Record your readings and bring them to your next visit.    Regards,   Eugenia Pancoast FNP-C

## 2022-05-09 DIAGNOSIS — R600 Localized edema: Secondary | ICD-10-CM | POA: Insufficient documentation

## 2022-05-09 NOTE — Assessment & Plan Note (Signed)
Start amlodipine 5 mg once daily Pt advised of the following:  Continue medication as prescribed. Monitor blood pressure periodically and/or when you feel symptomatic. Goal is <130/90 on average. Ensure that you have rested for 30 minutes prior to checking your blood pressure. Record your readings and bring them to your next visit if necessary.work on a low sodium diet.

## 2022-05-09 NOTE — Assessment & Plan Note (Signed)
Continue metformin 500 mg

## 2022-05-09 NOTE — Assessment & Plan Note (Signed)
Continue hctz 25 mg  As improvement with this medication Advised pt to elevate legs, and watch sodium intake daily. Advised to wear compression stockings as well.

## 2022-05-15 ENCOUNTER — Encounter (INDEPENDENT_AMBULATORY_CARE_PROVIDER_SITE_OTHER): Payer: Self-pay

## 2022-05-19 ENCOUNTER — Ambulatory Visit: Payer: Managed Care, Other (non HMO) | Admitting: Internal Medicine

## 2022-06-12 ENCOUNTER — Ambulatory Visit: Payer: 59 | Admitting: Family

## 2022-06-13 ENCOUNTER — Ambulatory Visit: Payer: 59 | Admitting: Family

## 2022-06-16 ENCOUNTER — Ambulatory Visit (INDEPENDENT_AMBULATORY_CARE_PROVIDER_SITE_OTHER): Payer: 59 | Admitting: Family

## 2022-06-16 ENCOUNTER — Encounter: Payer: Self-pay | Admitting: Family

## 2022-06-16 VITALS — BP 140/80 | HR 97 | Temp 98.2°F | Resp 16 | Ht 63.0 in | Wt 326.1 lb

## 2022-06-16 DIAGNOSIS — I10 Essential (primary) hypertension: Secondary | ICD-10-CM

## 2022-06-16 DIAGNOSIS — E1165 Type 2 diabetes mellitus with hyperglycemia: Secondary | ICD-10-CM | POA: Diagnosis not present

## 2022-06-16 DIAGNOSIS — E782 Mixed hyperlipidemia: Secondary | ICD-10-CM

## 2022-06-16 DIAGNOSIS — F902 Attention-deficit hyperactivity disorder, combined type: Secondary | ICD-10-CM

## 2022-06-16 MED ORDER — AMLODIPINE BESYLATE 10 MG PO TABS: 10.0000 mg | ORAL_TABLET | Freq: Every day | ORAL | 1 refills | Status: DC

## 2022-06-16 MED ORDER — AMPHETAMINE-DEXTROAMPHET ER 25 MG PO CP24: 25.0000 mg | ORAL_CAPSULE | ORAL | 0 refills | Status: AC

## 2022-06-16 NOTE — Progress Notes (Signed)
Established Patient Office Visit  Subjective:  Patient ID: Brandy Christensen, female    DOB: 07-07-1976  Age: 46 y.o. MRN: [id number]  CC:  Chief Complaint  Patient presents with   Hypertension    HPI Brandy Christensen is here today for follow up.   HTN: taking amlodipine 5 mg once daily as well as HCTZ 25 mg once daily as well. At home she checks her blood pressure and her average lower 140/70 or so. No cp palp and or sob. She does think hydration issues with HCTZ which has been affecting her, and she wants to try to come off of the HCTZ.   ADD: recent increase of 25 mg adderall XR , wit her psychiatrist.   HLD:  Lab Results  Component Value Date   CHOL 172 05/08/2022   HDL 36.20 (L) 05/08/2022   LDLCALC 122 (H) 06/29/2021   LDLDIRECT 112.0 05/08/2022   TRIG 217.0 (H) 05/08/2022   CHOLHDL 5 05/08/2022    DM2: taking metformin 500 mg XR  twice daily.  Lab Results  Component Value Date   HGBA1C 6.4 (A) 04/17/2022      Past Medical History:  Diagnosis Date   ADHD (attention deficit hyperactivity disorder)    Asthma    Diabetes mellitus without complication ([place])     Past Surgical History:  Procedure Laterality Date   BREAST BIOPSY Right    needle bx neg   COLONOSCOPY WITH PROPOFOL N/A 09/29/2021   Procedure: COLONOSCOPY WITH PROPOFOL;  Surgeon: Lucilla Lame, MD;  Location: [facility] ENDOSCOPY;  Service: Endoscopy;  Laterality: N/A;   thumb surgery      Family History  Problem Relation Age of Onset   Breast cancer Mother 51   Cancer Mother        breast   Crohn's disease Mother    Lymphoma Mother    Learning disabilities Father    Heart disease Father    Hearing loss Maternal Grandmother    Breast cancer Maternal Grandmother    Hearing loss Maternal Grandfather     Social History   Socioeconomic History   Marital status: Single    Spouse name: Not on file   Number of children: 0   Years of education: Not on file   Highest education level: Not on file   Occupational History    Employer: [employer]  Tobacco Use   Smoking status: Never   Smokeless tobacco: Never  Vaping Use   Vaping Use: Never used  Substance and Sexual Activity   Alcohol use: Not Currently   Drug use: Not Currently   Sexual activity: Not Currently    Partners: Female  Other Topics Concern   Not on file  Social History Narrative   Not on file   Social Determinants of Health   Financial Resource Strain: Not on file  Food Insecurity: Not on file  Transportation Needs: Not on file  Physical Activity: Not on file  Stress: Not on file  Social Connections: Not on file  Intimate Partner Violence: Not on file    Outpatient Medications Prior to Visit  Medication Sig Dispense Refill   albuterol (VENTOLIN HFA) 108 (90 Base) MCG/ACT inhaler Inhale 2 puffs into the lungs every 4 (four) hours as needed. 1 each 1   EPINEPHrine 0.3 mg/0.3 mL IJ SOAJ injection INJECT INTO OUTER THIGH FOR SEVERE ALLERGIC REACTION. CALL 911 AFTER USE. MAY SUBSTITUTE ANY BRAND     levocetirizine (XYZAL) 5 MG tablet Take 1 tablet (5 mg  total) by mouth every evening. 90 tablet 1   metFORMIN (GLUCOPHAGE-XR) 500 MG 24 hr tablet Take two tablets po bid 120 tablet 5   Multiple Vitamin (MULTIVITAMIN) tablet Take 1 tablet by mouth daily.     nortriptyline (PAMELOR) 25 MG capsule SMARTSIG:1 Capsule(s) By Mouth Every Evening 30 capsule 1   ADDERALL XR 20 MG 24 hr capsule Take 20 mg by mouth every morning.     amLODipine (NORVASC) 5 MG tablet Take 1 tablet (5 mg total) by mouth daily. 90 tablet 1   hydrochlorothiazide (HYDRODIURIL) 25 MG tablet Take 1 tablet (25 mg total) by mouth daily. 90 tablet 3   No facility-administered medications prior to visit.    Allergies  Allergen Reactions   Fluticasone Anaphylaxis    Throat closing, unable to breathe    Levofloxacin Anaphylaxis    Throat closing, unable to breathe    Semaglutide(0.25 Or 0.'5mg'$ -Dos) Other (See Comments)    Severe pancreatitis   Other reaction(s): foot swelling   Trichophyton    Cat Hair Extract     Other reaction(s): Per allergy testing   Dog Epithelium Allergy Skin Test     Other reaction(s): Per allergy testing (dog dander)   Dust Mite Extract     Other reaction(s): Per allergy testing   Gramineae Pollens    Losartan Other (See Comments)    Low diastolic, fainting    Metformin And Related     Metformin regular - GI upset. - mild to moderate.    Other     Other reaction(s): Per allergy testing   Prednisone Other (See Comments)    Paranoia. Also allergic to most steroids       Review of Systems  Respiratory:  Negative for shortness of breath.   Cardiovascular:  Negative for chest pain and palpitations.  Gastrointestinal:  Negative for constipation and diarrhea.  Genitourinary:  Negative for dysuria, frequency and urgency.  Musculoskeletal:  Negative for myalgias.  Psychiatric/Behavioral:  Negative for depression and suicidal ideas.   All other systems reviewed and are negative.    Objective:    Physical Exam Constitutional:      Appearance: Normal appearance. She is obese.  Cardiovascular:     Rate and Rhythm: Normal rate and regular rhythm.  Pulmonary:     Effort: Pulmonary effort is normal.     Breath sounds: Normal breath sounds. No wheezing.  Neurological:     General: No focal deficit present.     Mental Status: She is alert and oriented to person, place, and time. Mental status is at baseline.  Psychiatric:        Mood and Affect: Mood normal.        Behavior: Behavior normal.        Thought Content: Thought content normal.        Judgment: Judgment normal.      BP (!) 142/82   Pulse 97   Temp 98.2 F (36.8 C)   Resp 16   Ht '5\' 3"'$  (1.6 m)   Wt (!) 326 lb 2 oz (147.9 kg)   LMP 04/20/2022 (Approximate)   SpO2 97%   BMI 57.77 kg/m  Wt Readings from Last 3 Encounters:  06/16/22 (!) 326 lb 2 oz (147.9 kg)  05/08/22 (!) 324 lb (147 kg)  04/17/22 (!) 329 lb (149.2 kg)      Health Maintenance Due  Topic Date Due   FOOT EXAM  Never done   OPHTHALMOLOGY EXAM  Never done  Diabetic kidney evaluation - Urine ACR  01/19/2022   COVID-19 Vaccine (6 - 2023-24 season) 06/17/2022    There are no preventive care reminders to display for this patient.  Lab Results  Component Value Date   TSH 2.00 06/29/2021   Lab Results  Component Value Date   WBC 11.3 (H) 09/27/2021   HGB 13.2 09/27/2021   HCT 40.6 09/27/2021   MCV 82.7 09/27/2021   PLT 324.0 09/27/2021   Lab Results  Component Value Date   NA 134 (L) 05/08/2022   K 4.1 05/08/2022   CO2 30 05/08/2022   GLUCOSE 116 (H) 05/08/2022   BUN 11 05/08/2022   CREATININE 0.65 05/08/2022   BILITOT 0.4 09/27/2021   ALKPHOS 66 09/27/2021   AST 12 09/27/2021   ALT 21 09/27/2021   PROT 6.9 09/27/2021   ALBUMIN 4.3 09/27/2021   CALCIUM 9.6 05/08/2022   GFR 104.94 05/08/2022   Lab Results  Component Value Date   CHOL 172 05/08/2022   Lab Results  Component Value Date   HDL 36.20 (L) 05/08/2022   Lab Results  Component Value Date   LDLCALC 122 (H) 06/29/2021   Lab Results  Component Value Date   TRIG 217.0 (H) 05/08/2022   Lab Results  Component Value Date   CHOLHDL 5 05/08/2022   Lab Results  Component Value Date   HGBA1C 6.4 (A) 04/17/2022      Assessment & Plan:   Problem List Items Addressed This Visit       Cardiovascular and Mediastinum   Primary hypertension    Stop hctz  Increase amlodipine to 10 mg once daily  Pt advised of the following:  Continue medication as prescribed. Monitor blood pressure periodically and/or when you feel symptomatic. Goal is <130/90 on average. Ensure that you have rested for 30 minutes prior to checking your blood pressure. Record your readings and bring them to your next visit if necessary.work on a low sodium diet.       Relevant Medications   amLODipine (NORVASC) 10 MG tablet     Endocrine   Hyperglycemia due to type 2 diabetes  mellitus ([place])    Work on diabetic diet and exercise as tolerated. Yearly foot exam, and annual eye exam.          Other   Attention deficit hyperactivity disorder (ADHD) - Primary    Continue current dose, f/u with psychiatrist as scheduled.       Relevant Medications   amphetamine-dextroamphetamine (ADDERALL XR) 25 MG 24 hr capsule   amLODipine (NORVASC) 10 MG tablet   Hyperlipidemia    Work on low cholesterol diet and exercise as tolerated       Relevant Medications   amLODipine (NORVASC) 10 MG tablet    Meds ordered this encounter  Medications   amphetamine-dextroamphetamine (ADDERALL XR) 25 MG 24 hr capsule    Sig: Take 1 capsule by mouth every morning.    Refill:  0    Order Specific Question:   Supervising Provider    Answer:   BEDSOLE, AMY E [[id number]]   amLODipine (NORVASC) 10 MG tablet    Sig: Take 1 tablet (10 mg total) by mouth daily.    Dispense:  90 tablet    Refill:  1    Order Specific Question:   Supervising Provider    Answer:   BEDSOLE, AMY E [[id number]]    Follow-up: Return in about 6 weeks (around 07/28/2022) for f/u blood pressure.    Eugenia Pancoast,  FNP 

## 2022-06-16 NOTE — Assessment & Plan Note (Signed)
Stop hctz  Increase amlodipine to 10 mg once daily  Pt advised of the following:  Continue medication as prescribed. Monitor blood pressure periodically and/or when you feel symptomatic. Goal is <130/90 on average. Ensure that you have rested for 30 minutes prior to checking your blood pressure. Record your readings and bring them to your next visit if necessary.work on a low sodium diet.

## 2022-06-16 NOTE — Assessment & Plan Note (Signed)
Work on diabetic diet and exercise as tolerated. Yearly foot exam, and annual eye exam.

## 2022-06-16 NOTE — Assessment & Plan Note (Signed)
Continue current dose, f/u with psychiatrist as scheduled.

## 2022-06-16 NOTE — Patient Instructions (Signed)
  Increase amlodipine to 10 mg once daily  Stop hctz  Monitor blood pressure to see if improvement.    Regards,   Eugenia Pancoast FNP-C

## 2022-06-16 NOTE — Assessment & Plan Note (Signed)
Work on low cholesterol diet and exercise as tolerated

## 2022-06-22 ENCOUNTER — Other Ambulatory Visit: Payer: Self-pay | Admitting: Family

## 2022-06-22 DIAGNOSIS — Z1231 Encounter for screening mammogram for malignant neoplasm of breast: Secondary | ICD-10-CM

## 2022-07-24 ENCOUNTER — Ambulatory Visit
Admission: RE | Admit: 2022-07-24 | Discharge: 2022-07-24 | Disposition: A | Discharge status: Home / Self Care | Payer: 59 | Source: Ambulatory Visit | Attending: Family | Admitting: Family

## 2022-07-24 DIAGNOSIS — Z1231 Encounter for screening mammogram for malignant neoplasm of breast: Secondary | ICD-10-CM | POA: Insufficient documentation

## 2022-07-28 ENCOUNTER — Ambulatory Visit: Payer: 59 | Admitting: Family

## 2022-08-04 ENCOUNTER — Ambulatory Visit (INDEPENDENT_AMBULATORY_CARE_PROVIDER_SITE_OTHER): Payer: 59 | Admitting: Family

## 2022-08-04 VITALS — BP 130/78 | HR 96 | Temp 98.4°F | Ht 63.0 in | Wt 326.8 lb

## 2022-08-04 DIAGNOSIS — F909 Attention-deficit hyperactivity disorder, unspecified type: Secondary | ICD-10-CM | POA: Diagnosis not present

## 2022-08-04 DIAGNOSIS — E1165 Type 2 diabetes mellitus with hyperglycemia: Secondary | ICD-10-CM

## 2022-08-04 DIAGNOSIS — R600 Localized edema: Secondary | ICD-10-CM

## 2022-08-04 DIAGNOSIS — I10 Essential (primary) hypertension: Secondary | ICD-10-CM

## 2022-08-04 MED ORDER — AMLODIPINE BESYLATE 5 MG PO TABS: 5.0000 mg | ORAL_TABLET | Freq: Every day | ORAL | 0 refills | Status: DC

## 2022-08-04 MED ORDER — FUROSEMIDE 20 MG PO TABS: 20.0000 mg | ORAL_TABLET | Freq: Every day | ORAL | 0 refills | Status: DC

## 2022-08-04 MED ORDER — VALSARTAN 80 MG PO TABS: 80.0000 mg | ORAL_TABLET | Freq: Every day | ORAL | 3 refills | Status: DC

## 2022-08-04 NOTE — Assessment & Plan Note (Addendum)
Continue Metformin '1000mg'$  twice daily. Re-educated the importance of a diabetic diet and exercise. Patient will have Hgb A1C checked in 1 week at follow-up appointment.

## 2022-08-04 NOTE — Patient Instructions (Addendum)
  For this week, let's continue amlodipine 10 mg once daily  Continue HCTZ 25 mg once daily  Start furosemide 20 meq once daily.   Return in one week, I will check potassium level.  Elevate legs, watch sodium in diet.    Regards,   Eugenia Pancoast FNP-C

## 2022-08-04 NOTE — Assessment & Plan Note (Addendum)
Will continue with Amodipine '10mg'$  and HCTZ '25mg'$  for a week. Will start Furosemide '20mg'$  daily x 1 week for Once patient finishes short course of Furosemide, will decrease Amlodipine dose to '5mg'$  and start Valsartan '80mg'$ . Patient to continue keeping blood pressure log.

## 2022-08-04 NOTE — Progress Notes (Signed)
Established Patient Office Visit  Subjective:  Patient ID: Brandy Christensen, female    DOB: 1976-02-21  Age: 47 y.o. MRN: [id number]  CC:  Chief Complaint  Patient presents with   Hypertension    HPI Brandy Christensen is here today for follow up.   HTN: does find that her blood pressure at night is a bit higher around 140/80 or so and in the am >130 <90. Hctz 25 once daily as well. She states pedal edema has worsened a bit since increasing her amlodipine. Allergy to losartan listed, and caused her diastolic to fall however she is not sure she had truly had a high blood pressure at that time they gave it.   DM2: metfomrin 1000 mg twice daily, tolerating well. Working  on diabetic diet exercising as tolerated.  Lab Results  Component Value Date   HGBA1C 6.4 (A) 04/17/2022   ADD: on adderall ER 25 mg with Dr. Sabra Heck. Helping her focus a lot more.    Past Medical History:  Diagnosis Date   ADHD (attention deficit hyperactivity disorder)    Asthma    Diabetes mellitus without complication ([place])     Past Surgical History:  Procedure Laterality Date   BREAST BIOPSY Right    needle bx neg   COLONOSCOPY WITH PROPOFOL N/A 09/29/2021   Procedure: COLONOSCOPY WITH PROPOFOL;  Surgeon: Lucilla Lame, MD;  Location: [facility] END[facility]COPY;  Service: Endoscopy;  Laterality: N/A;   thumb surgery      Family History  Problem Relation Age of Onset   Breast cancer Mother 80   Cancer Mother        breast   Crohn's disease Mother    Lymphoma Mother    Learning disabilities Father    Heart disease Father    Hearing loss Maternal Grandmother    Breast cancer Maternal Grandmother    Hearing loss Maternal Grandfather     Social History   Socioeconomic History   Marital status: Single    Spouse name: Not on file   Number of children: 0   Years of education: Not on file   Highest education level: Not on file  Occupational History    Employer: [employer]  Tobacco Use   Smoking status:  Never   Smokeless tobacco: Never  Vaping Use   Vaping Use: Never used  Substance and Sexual Activity   Alcohol use: Not Currently   Drug use: Not Currently   Sexual activity: Not Currently    Partners: Female  Other Topics Concern   Not on file  Social History Narrative   Not on file   Social Determinants of Health   Financial Resource Strain: Not on file  Food Insecurity: Not on file  Transportation Needs: Not on file  Physical Activity: Not on file  Stress: Not on file  Social Connections: Not on file  Intimate Partner Violence: Not on file    Outpatient Medications Prior to Visit  Medication Sig Dispense Refill   amphetamine-dextroamphetamine (ADDERALL XR) 25 MG 24 hr capsule Take 1 capsule by mouth every morning.  0   budesonide-formoterol (SYMBICORT) 160-4.5 MCG/ACT inhaler 2 puffs Inhalation Twice a day     EPINEPHrine 0.3 mg/0.3 mL IJ SOAJ injection INJECT INTO OUTER THIGH FOR SEVERE ALLERGIC REACTION. CALL 911 AFTER USE. MAY SUBSTITUTE ANY BRAND     hydrochlorothiazide (HYDRODIURIL) 25 MG tablet Take 25 mg by mouth daily.     levocetirizine (XYZAL) 5 MG tablet Take 1 tablet (5 mg total) by  mouth every evening. 90 tablet 1   metFORMIN (GLUCOPHAGE-XR) 500 MG 24 hr tablet Take two tablets po bid 120 tablet 5   Multiple Vitamin (MULTIVITAMIN) tablet Take 1 tablet by mouth daily.     nortriptyline (PAMELOR) 25 MG capsule SMARTSIG:1 Capsule(s) By Mouth Every Evening 30 capsule 1   amLODipine (NORVASC) 10 MG tablet Take 1 tablet (10 mg total) by mouth daily. 90 tablet 1   albuterol (VENTOLIN HFA) 108 (90 Base) MCG/ACT inhaler Inhale 2 puffs into the lungs every 4 (four) hours as needed. 1 each 1   Magnesium 400 MG TABS 1 tablet with a meal Orally Once a day     No facility-administered medications prior to visit.    Allergies  Allergen Reactions   Fluticasone Anaphylaxis    Throat closing, unable to breathe    Levofloxacin Anaphylaxis    Throat closing, unable to  breathe    Semaglutide(0.25 Or 0.'5mg'$ -Dos) Other (See Comments)    Severe pancreatitis  Other reaction(s): foot swelling   Trichophyton    Cat Hair Extract     Other reaction(s): Per allergy testing   Dog Epithelium Allergy Skin Test     Other reaction(s): Per allergy testing (dog dander)   Dust Mite Extract     Other reaction(s): Per allergy testing   Gramineae Pollens    Metformin And Related     Metformin regular - GI upset. - mild to moderate.    Other     Other reaction(s): Per allergy testing   Prednisone Other (See Comments)    Paranoia. Also allergic to most steroids       ROS: Pertinent symptoms negative unless otherwise noted in HPI      Objective:    Physical Exam Vitals reviewed.  Constitutional:      Appearance: Normal appearance.  Eyes:     General:        Right eye: No discharge.        Left eye: No discharge.     Conjunctiva/sclera: Conjunctivae normal.  Cardiovascular:     Rate and Rhythm: Normal rate.  Pulmonary:     Effort: Pulmonary effort is normal. No respiratory distress.  Musculoskeletal:        General: Normal range of motion.     Cervical back: Normal range of motion.     Right lower leg: 3+ Edema present.     Left lower leg: 3+ Edema present.  Neurological:     General: No focal deficit present.     Mental Status: She is alert and oriented to person, place, and time. Mental status is at baseline.  Psychiatric:        Mood and Affect: Mood normal.        Behavior: Behavior normal.        Thought Content: Thought content normal.        Judgment: Judgment normal.      BP 130/78   Pulse 96   Temp 98.4 F (36.9 C) (Oral)   Ht '5\' 3"'$  (1.6 m)   Wt (!) 326 lb 12.8 oz (148.2 kg)   SpO2 97%   BMI 57.89 kg/m  Wt Readings from Last 3 Encounters:  08/04/22 (!) 326 lb 12.8 oz (148.2 kg)  06/16/22 (!) 326 lb 2 oz (147.9 kg)  05/08/22 (!) 324 lb (147 kg)     There are no preventive care reminders to display for this  patient.  There are no preventive care reminders to display for this  patient.  Lab Results  Component Value Date   TSH 2.00 06/29/2021   Lab Results  Component Value Date   WBC 11.3 (H) 09/27/2021   HGB 13.2 09/27/2021   HCT 40.6 09/27/2021   MCV 82.7 09/27/2021   PLT 324.0 09/27/2021   Lab Results  Component Value Date   NA 134 (L) 05/08/2022   K 4.1 05/08/2022   CO2 30 05/08/2022   GLUCOSE 116 (H) 05/08/2022   BUN 11 05/08/2022   CREATININE 0.65 05/08/2022   BILITOT 0.4 09/27/2021   ALKPHOS 66 09/27/2021   AST 12 09/27/2021   ALT 21 09/27/2021   PROT 6.9 09/27/2021   ALBUMIN 4.3 09/27/2021   CALCIUM 9.6 05/08/2022   GFR 104.94 05/08/2022   Lab Results  Component Value Date   CHOL 172 05/08/2022   Lab Results  Component Value Date   HDL 36.20 (L) 05/08/2022   Lab Results  Component Value Date   LDLCALC 122 (H) 06/29/2021   Lab Results  Component Value Date   TRIG 217.0 (H) 05/08/2022   Lab Results  Component Value Date   CHOLHDL 5 05/08/2022   Lab Results  Component Value Date   HGBA1C 6.4 (A) 04/17/2022      Assessment & Plan:   Problem List Items Addressed This Visit       Cardiovascular and Mediastinum   Primary hypertension - Primary    Will continue with Amodipine '10mg'$  and HCTZ '25mg'$  for a week. Will start Furosemide '20mg'$  daily x 1 week for Once patient finishes short course of Furosemide, will decrease Amlodipine dose to '5mg'$  and start Valsartan '80mg'$ . Patient to continue keeping blood pressure log.      Relevant Medications   hydrochlorothiazide (HYDRODIURIL) 25 MG tablet   furosemide (LASIX) 20 MG tablet   amLODipine (NORVASC) 5 MG tablet     Endocrine   Hyperglycemia due to type 2 diabetes mellitus ([facility])    Continue Metformin '1000mg'$  twice daily. Re-educated the importance of a diabetic diet and exercise. Patient will have Hgb A1C checked in 1 week at follow-up appointment.         Other   Attention deficit hyperactivity  disorder (ADHD)    Stable. Continue Adderall XR '25mg'$ . Follow-up with psychiatry as scheduled.       Pedal edema    Rx for Furosemide '20mg'$  daily for 1 week. Patient will follow-up in a week for re-evaluation and to check potassium levels.   I evaluated the patient,  was consulted regarding plans for treatment of care, and agree with the assessment and plan per Joya Gaskins, RN, DNP student.  Eugenia Pancoast, FNP-C       Relevant Medications   furosemide (LASIX) 20 MG tablet    Meds ordered this encounter  Medications   DISCONTD: valsartan (DIOVAN) 80 MG tablet    Sig: Take 1 tablet (80 mg total) by mouth daily.    Dispense:  90 tablet    Refill:  3    Order Specific Question:   Supervising Provider    Answer:   BEDSOLE, AMY E [[id number]]   furosemide (LASIX) 20 MG tablet    Sig: Take 1 tablet (20 mg total) by mouth daily for 10 days.    Dispense:  10 tablet    Refill:  0    Order Specific Question:   Supervising Provider    Answer:   BEDSOLE, AMY E [[id number]]   amLODipine (NORVASC) 5 MG tablet    Sig: Take 1  tablet (5 mg total) by mouth daily.    Dispense:  90 tablet    Refill:  0    Order Specific Question:   Supervising Provider    Answer:   Diona Browner, AMY E [[id number]]    Follow-up: Return in about 1 week (around 08/11/2022) for f/u blood pressure.    Eugenia Pancoast, FNP

## 2022-08-04 NOTE — Assessment & Plan Note (Addendum)
Rx for Furosemide '20mg'$  daily for 1 week. Patient will follow-up in a week for re-evaluation and to check potassium levels.   I evaluated the patient,  was consulted regarding plans for treatment of care, and agree with the assessment and plan per Joya Gaskins, RN, DNP student.  -Eugenia Pancoast, FNP-C

## 2022-08-04 NOTE — Assessment & Plan Note (Signed)
Stable. Continue Adderall XR '25mg'$ . Follow-up with psychiatry as scheduled.

## 2022-08-04 NOTE — Progress Notes (Signed)
Established Patient Office Visit  Subjective:  Patient ID: Brandy Christensen, female    DOB: [birth year]  Age: 47 y.o. MRN: [id number]  CC:  Chief Complaint  Patient presents with   Hypertension    HPI Brandy Christensen is here today with follow-up.  HTN: reports blood pressure has been averaging 130s/70s in the morning and at night increases to about 140s/90s at night at home. Is taking Amlodipine '10mg'$  daily and HCTZ '25mg'$  once daily. Denies chest pain, SOB, palpitations, and lightheadedness. Reports that she has noticed an increased in lower leg edema since Amlodipine dosage was increased a month ago.   DM2: continue to take Metformin '1000mg'$   twice daily. Reports that she is tolerating medication.  Lab Results  Component Value Date   HGBA1C 6.4 (A) 04/17/2022    ADHD: states that increase in Adderall has been doing great. She is able to focus a lot more. Taking Adderall '25mg'$  XR daily Has an appointment to see psychiatrist on 08/17/2021  Past Medical History:  Diagnosis Date   ADHD (attention deficit hyperactivity disorder)    Asthma    Diabetes mellitus without complication ([place])     Past Surgical History:  Procedure Laterality Date   BREAST BIOPSY Right    needle bx neg   COLONOSCOPY WITH PROPOFOL N/A 09/29/2021   Procedure: COLONOSCOPY WITH PROPOFOL;  Surgeon: Lucilla Lame, MD;  Location: [facility] ENDOSCOPY;  Service: Endoscopy;  Laterality: N/A;   thumb surgery      Family History  Problem Relation Age of Onset   Breast cancer Mother 61   Cancer Mother        breast   Crohn's disease Mother    Lymphoma Mother    Learning disabilities Father    Heart disease Father    Hearing loss Maternal Grandmother    Breast cancer Maternal Grandmother    Hearing loss Maternal Grandfather     Social History   Socioeconomic History   Marital status: Single    Spouse name: Not on file   Number of children: 0   Years of education: Not on file   Highest education level: Not on file   Occupational History    Employer: [employer]  Tobacco Use   Smoking status: Never   Smokeless tobacco: Never  Vaping Use   Vaping Use: Never used  Substance and Sexual Activity   Alcohol use: Not Currently   Drug use: Not Currently   Sexual activity: Not Currently    Partners: Female  Other Topics Concern   Not on file  Social History Narrative   Not on file   Social Determinants of Health   Financial Resource Strain: Not on file  Food Insecurity: Not on file  Transportation Needs: Not on file  Physical Activity: Not on file  Stress: Not on file  Social Connections: Not on file  Intimate Partner Violence: Not on file    Outpatient Medications Prior to Visit  Medication Sig Dispense Refill   amphetamine-dextroamphetamine (ADDERALL XR) 25 MG 24 hr capsule Take 1 capsule by mouth every morning.  0   budesonide-formoterol (SYMBICORT) 160-4.5 MCG/ACT inhaler 2 puffs Inhalation Twice a day     EPINEPHrine 0.3 mg/0.3 mL IJ SOAJ injection INJECT INTO OUTER THIGH FOR SEVERE ALLERGIC REACTION. CALL 911 AFTER USE. MAY SUBSTITUTE ANY BRAND     hydrochlorothiazide (HYDRODIURIL) 25 MG tablet Take 25 mg by mouth daily.     levocetirizine (XYZAL) 5 MG tablet Take 1 tablet (5 mg total) by  mouth every evening. 90 tablet 1   metFORMIN (GLUCOPHAGE-XR) 500 MG 24 hr tablet Take two tablets po bid 120 tablet 5   Multiple Vitamin (MULTIVITAMIN) tablet Take 1 tablet by mouth daily.     nortriptyline (PAMELOR) 25 MG capsule SMARTSIG:1 Capsule(s) By Mouth Every Evening 30 capsule 1   amLODipine (NORVASC) 10 MG tablet Take 1 tablet (10 mg total) by mouth daily. 90 tablet 1   albuterol (VENTOLIN HFA) 108 (90 Base) MCG/ACT inhaler Inhale 2 puffs into the lungs every 4 (four) hours as needed. 1 each 1   Magnesium 400 MG TABS 1 tablet with a meal Orally Once a day     No facility-administered medications prior to visit.    Allergies  Allergen Reactions   Fluticasone Anaphylaxis    Throat  closing, unable to breathe    Levofloxacin Anaphylaxis    Throat closing, unable to breathe    Semaglutide(0.25 Or 0.'5mg'$ -Dos) Other (See Comments)    Severe pancreatitis  Other reaction(s): foot swelling   Trichophyton    Cat Hair Extract     Other reaction(s): Per allergy testing   Dog Epithelium Allergy Skin Test     Other reaction(s): Per allergy testing (dog dander)   Dust Mite Extract     Other reaction(s): Per allergy testing   Gramineae Pollens    Metformin And Related     Metformin regular - GI upset. - mild to moderate.    Other     Other reaction(s): Per allergy testing   Prednisone Other (See Comments)    Paranoia. Also allergic to most steroids     ROS: Negative unless otherwise stated in HPI    Objective:    Physical Exam Vitals reviewed.  Constitutional:      Appearance: She is obese.  Cardiovascular:     Rate and Rhythm: Normal rate and regular rhythm.     Pulses: Normal pulses.     Heart sounds: Normal heart sounds.  Pulmonary:     Effort: Pulmonary effort is normal.     Breath sounds: Normal breath sounds.  Abdominal:     General: Bowel sounds are normal.     Palpations: Abdomen is soft.  Musculoskeletal:     Right lower leg: 3+ Edema present.     Left lower leg: 3+ Edema present.  Skin:    General: Skin is warm and dry.     Capillary Refill: Capillary refill takes less than 2 seconds.  Neurological:     General: No focal deficit present.     Mental Status: She is alert and oriented to person, place, and time.  Psychiatric:        Mood and Affect: Mood normal.        Behavior: Behavior normal.        Thought Content: Thought content normal.        Judgment: Judgment normal.     BP 130/78   Pulse 96   Temp 98.4 F (36.9 C) (Oral)   Ht '5\' 3"'$  (1.6 m)   Wt (!) 326 lb 12.8 oz (148.2 kg)   SpO2 97%   BMI 57.89 kg/m  Wt Readings from Last 3 Encounters:  08/04/22 (!) 326 lb 12.8 oz (148.2 kg)  06/16/22 (!) 326 lb 2 oz (147.9 kg)  05/08/22  (!) 324 lb (147 kg)     There are no preventive care reminders to display for this patient.  There are no preventive care reminders to display for this patient.  Lab Results  Component Value Date   TSH 2.00 06/29/2021   Lab Results  Component Value Date   WBC 11.3 (H) 09/27/2021   HGB 13.2 09/27/2021   HCT 40.6 09/27/2021   MCV 82.7 09/27/2021   PLT 324.0 09/27/2021   Lab Results  Component Value Date   NA 134 (L) 05/08/2022   K 4.1 05/08/2022   CO2 30 05/08/2022   GLUCOSE 116 (H) 05/08/2022   BUN 11 05/08/2022   CREATININE 0.65 05/08/2022   BILITOT 0.4 09/27/2021   ALKPHOS 66 09/27/2021   AST 12 09/27/2021   ALT 21 09/27/2021   PROT 6.9 09/27/2021   ALBUMIN 4.3 09/27/2021   CALCIUM 9.6 05/08/2022   GFR 104.94 05/08/2022   Lab Results  Component Value Date   HGBA1C 6.4 (A) 04/17/2022      Assessment & Plan:   Problem List Items Addressed This Visit       Cardiovascular and Mediastinum   Primary hypertension - Primary    Will continue with Amodipine '10mg'$  and HCTZ '25mg'$  for a week. Will start Furosemide '20mg'$  daily x 1 week for Once patient finishes short course of Furosemide, will decrease Amlodipine dose to '5mg'$  and start Valsartan '80mg'$ . Patient to continue keeping blood pressure log.      Relevant Medications   hydrochlorothiazide (HYDRODIURIL) 25 MG tablet   furosemide (LASIX) 20 MG tablet   amLODipine (NORVASC) 5 MG tablet     Endocrine   Hyperglycemia due to type 2 diabetes mellitus (HCC)    Continue Metformin '1000mg'$  twice daily. Re-educated the importance of a diabetic diet and exercise. Patient will have Hgb A1C checked in 1 week at follow-up appointment.         Other   Attention deficit hyperactivity disorder (ADHD)    Stable. Continue Adderall XR '25mg'$ . Follow-up with psychiatry as scheduled.       Pedal edema    Rx for Furosemide '20mg'$  daily for 1 week. Patient will follow-up in a week for re-evaluation and to check potassium levels.        Relevant Medications   furosemide (LASIX) 20 MG tablet    Meds ordered this encounter  Medications   DISCONTD: valsartan (DIOVAN) 80 MG tablet    Sig: Take 1 tablet (80 mg total) by mouth daily.    Dispense:  90 tablet    Refill:  3    Order Specific Question:   Supervising Provider    Answer:   BEDSOLE, AMY E [[id number]]   furosemide (LASIX) 20 MG tablet    Sig: Take 1 tablet (20 mg total) by mouth daily for 10 days.    Dispense:  10 tablet    Refill:  0    Order Specific Question:   Supervising Provider    Answer:   BEDSOLE, AMY E [[id number]]   amLODipine (NORVASC) 5 MG tablet    Sig: Take 1 tablet (5 mg total) by mouth daily.    Dispense:  90 tablet    Refill:  0    Order Specific Question:   Supervising Provider    Answer:   Diona Browner, AMY E [[id number]]    Follow-up: Return in about 1 week (around 08/11/2022) for f/u blood pressure.    Johny Shock, RN AGNP-student

## 2022-08-14 ENCOUNTER — Encounter: Payer: Self-pay | Admitting: Family

## 2022-08-14 ENCOUNTER — Ambulatory Visit (INDEPENDENT_AMBULATORY_CARE_PROVIDER_SITE_OTHER): Payer: 59 | Admitting: Family

## 2022-08-14 VITALS — BP 134/82 | HR 97 | Temp 98.6°F | Ht 63.0 in | Wt 324.0 lb

## 2022-08-14 DIAGNOSIS — I10 Essential (primary) hypertension: Secondary | ICD-10-CM

## 2022-08-14 DIAGNOSIS — Z5181 Encounter for therapeutic drug level monitoring: Secondary | ICD-10-CM | POA: Diagnosis not present

## 2022-08-14 DIAGNOSIS — E1165 Type 2 diabetes mellitus with hyperglycemia: Secondary | ICD-10-CM | POA: Diagnosis not present

## 2022-08-14 DIAGNOSIS — E782 Mixed hyperlipidemia: Secondary | ICD-10-CM

## 2022-08-14 DIAGNOSIS — Z79899 Other long term (current) drug therapy: Secondary | ICD-10-CM

## 2022-08-14 DIAGNOSIS — F909 Attention-deficit hyperactivity disorder, unspecified type: Secondary | ICD-10-CM

## 2022-08-14 LAB — HEMOGLOBIN A1C: Hgb A1c MFr Bld: 7.5 % — ABNORMAL HIGH (ref 4.6–6.5)

## 2022-08-14 LAB — LIPID PANEL
Cholesterol: 178 mg/dL (ref 0–200)
HDL: 36.4 mg/dL — ABNORMAL LOW (ref >39.00)
NonHDL: 141.73
Total CHOL/HDL Ratio: 5
Triglycerides: 303 mg/dL — ABNORMAL HIGH (ref 0.0–149.0)
VLDL: 60.6 mg/dL — ABNORMAL HIGH (ref 0.0–40.0)

## 2022-08-14 LAB — BASIC METABOLIC PANEL
BUN: 9 mg/dL (ref 6–23)
CO2: 31 mEq/L (ref 19–32)
Calcium: 9.7 mg/dL (ref 8.4–10.5)
Chloride: 93 mEq/L — ABNORMAL LOW (ref 96–112)
Creatinine, Ser: 0.68 mg/dL (ref 0.40–1.20)
GFR: 103.61 mL/min (ref >60.00)
Glucose, Bld: 137 mg/dL — ABNORMAL HIGH (ref 70–99)
Potassium: 4 mEq/L (ref 3.5–5.1)
Sodium: 137 mEq/L (ref 135–145)

## 2022-08-14 LAB — MICROALBUMIN / CREATININE URINE RATIO
Creatinine,U: 11.1 mg/dL
Microalb Creat Ratio: 14.1 mg/g (ref 0.0–30.0)
Microalb, Ur: 1.6 mg/dL (ref 0.0–1.9)

## 2022-08-14 LAB — LDL CHOLESTEROL, DIRECT: Direct LDL: 103 mg/dL

## 2022-08-14 NOTE — Progress Notes (Signed)
Established Patient Office Visit  Subjective:      CC:  Chief Complaint  Patient presents with   Medical Management of Chronic Issues    HPI: Brandy Christensen is a 47 y.o. female presenting on 08/14/2022 for Medical Management of Chronic Issues . Bil pedal edema, started furosemide 20 mg once daily last week, peeing often. She is finding improvement with the swelling in her lower legs much better.   HTN: did not yet start valsartan but she is going to take this today and then will decrease amlodipine to 5 mg once daily. She is tolerating well blood pressure around 130/80 on regular basis.      Social history:  Relevant past medical, surgical, family and social history reviewed and updated as indicated. Interim medical history since our last visit reviewed.  Allergies and medications reviewed and updated.  DATA REVIEWED: CHART IN EPIC     ROS: Negative unless specifically indicated above in HPI.    Current Outpatient Medications:    Albuterol Sulfate (PROAIR RESPICLICK) 672 (90 Base) MCG/ACT AEPB, 1 puff as needed Inhalation every 4 hrs, Disp: , Rfl:    amLODipine (NORVASC) 5 MG tablet, Take 1 tablet (5 mg total) by mouth daily., Disp: 90 tablet, Rfl: 0   amphetamine-dextroamphetamine (ADDERALL XR) 25 MG 24 hr capsule, Take 1 capsule by mouth every morning., Disp: , Rfl: 0   budesonide-formoterol (SYMBICORT) 160-4.5 MCG/ACT inhaler, 2 puffs Inhalation Twice a day, Disp: , Rfl:    EPINEPHrine 0.3 mg/0.3 mL IJ SOAJ injection, INJECT INTO OUTER THIGH FOR SEVERE ALLERGIC REACTION. CALL 911 AFTER USE. MAY SUBSTITUTE ANY BRAND, Disp: , Rfl:    furosemide (LASIX) 20 MG tablet, Take 1 tablet (20 mg total) by mouth daily for 10 days., Disp: 10 tablet, Rfl: 0   hydrochlorothiazide (HYDRODIURIL) 25 MG tablet, Take 25 mg by mouth daily., Disp: , Rfl:    levocetirizine (XYZAL) 5 MG tablet, Take 1 tablet (5 mg total) by mouth every evening., Disp: 90 tablet, Rfl: 1   Magnesium 400 MG  TABS, 1 tablet with a meal Orally Once a day, Disp: , Rfl:    metFORMIN (GLUCOPHAGE-XR) 500 MG 24 hr tablet, Take two tablets po bid, Disp: 120 tablet, Rfl: 5   Multiple Vitamin (MULTIVITAMIN) tablet, Take 1 tablet by mouth daily., Disp: , Rfl:    nortriptyline (PAMELOR) 25 MG capsule, SMARTSIG:1 Capsule(s) By Mouth Every Evening, Disp: 30 capsule, Rfl: 1   nortriptyline (PAMELOR) 25 MG capsule, 1 capsule Orally Once a day, Disp: , Rfl:    valsartan (DIOVAN) 80 MG tablet, Take 80 mg by mouth daily., Disp: , Rfl:    Atomoxetine HCl (STRATTERA PO), , Disp: , Rfl:       Objective:    BP 134/82   Pulse 97   Temp 98.6 F (37 C) (Oral)   Ht '5\' 3"'$  (1.6 m)   Wt (!) 324 lb (147 kg)   SpO2 98%   BMI 57.39 kg/m   Wt Readings from Last 3 Encounters:  08/14/22 (!) 324 lb (147 kg)  08/04/22 (!) 326 lb 12.8 oz (148.2 kg)  06/16/22 (!) 326 lb 2 oz (147.9 kg)    Physical Exam Vitals reviewed.  Constitutional:      General: She is not in acute distress.    Appearance: Normal appearance. She is obese. She is not ill-appearing, toxic-appearing or diaphoretic.  HENT:     Head: Normocephalic.  Cardiovascular:     Rate and Rhythm: Normal rate.  Pulmonary:     Effort: Pulmonary effort is normal.  Musculoskeletal:        General: Normal range of motion.     Right lower leg: 2+ Edema present.     Left lower leg: 1+ Edema present.  Neurological:     General: No focal deficit present.     Mental Status: She is alert and oriented to person, place, and time. Mental status is at baseline.  Psychiatric:        Mood and Affect: Mood normal.        Behavior: Behavior normal.        Thought Content: Thought content normal.        Judgment: Judgment normal.           Assessment & Plan:  Encounter for monitoring diuretic therapy Assessment & Plan: Bmp pending potassium results Complete furosemide as prescribed If still pedal edema not fully resolving may consider increasing valsartan d/c  amlodipine  Orders: -     Basic metabolic panel  Elevated triglycerides with high cholesterol Assessment & Plan: Ordered lipid panel, pending results. Work on low cholesterol diet and exercise as tolerated   Orders: -     Lipid panel  Primary hypertension Assessment & Plan: Keep at decreased dose amlodipine 5 mg once daily  Start valsartan 80 mg  Goal <140/90    Orders: -     Microalbumin / creatinine urine ratio  Type 2 diabetes mellitus with hyperglycemia, without long-term current use of insulin (HCC) -     Microalbumin / creatinine urine ratio -     Hemoglobin A1c     Return in about 1 month (around 09/14/2022) for f/u blood pressure.  Eugenia Pancoast, MSN, APRN, FNP-C [place]

## 2022-08-14 NOTE — Assessment & Plan Note (Signed)
Bmp pending potassium results Complete furosemide as prescribed If still pedal edema not fully resolving may consider increasing valsartan d/c amlodipine

## 2022-08-14 NOTE — Patient Instructions (Signed)
Cotinue at 5 mg amlodipine ,and start valsartan 80 mg once daily.   .Stop by the lab prior to leaving today. I will notify you of your results once received.    Regards,   Eugenia Pancoast FNP-C

## 2022-08-14 NOTE — Assessment & Plan Note (Signed)
Ordered lipid panel, pending results. Work on low cholesterol diet and exercise as tolerated ? ?

## 2022-08-14 NOTE — Assessment & Plan Note (Signed)
Keep at decreased dose amlodipine 5 mg once daily  Start valsartan 80 mg  Goal <140/90

## 2022-08-15 MED ORDER — DAPAGLIFLOZIN PROPANEDIOL 5 MG PO TABS: 5.0000 mg | ORAL_TABLET | Freq: Every day | ORAL | 1 refills | Status: DC

## 2022-08-15 NOTE — Addendum Note (Signed)
Addended by: Eugenia Pancoast on: 08/15/2022 10:31 AM   Modules accepted: Orders

## 2022-08-15 NOTE — Progress Notes (Signed)
Triglycerides are pretty high. Are you taking fish oils? If not I recommend you starting.  Your diabetes has increased significantly. We need to discuss another medication agent for your diabetes control. Have you tried a medication called Wilder Glade? It's a pill that we can stick to taking once a day along with your metformin. I suggest you follow up in three months to repeat your lab levels.

## 2022-09-14 ENCOUNTER — Encounter: Payer: Self-pay | Admitting: Family

## 2022-09-14 ENCOUNTER — Ambulatory Visit (INDEPENDENT_AMBULATORY_CARE_PROVIDER_SITE_OTHER): Payer: 59 | Admitting: Family

## 2022-09-14 VITALS — BP 132/84 | HR 94 | Temp 97.8°F | Ht 63.0 in | Wt 324.6 lb

## 2022-09-14 DIAGNOSIS — E66813 Obesity, class 3: Secondary | ICD-10-CM | POA: Insufficient documentation

## 2022-09-14 DIAGNOSIS — E11649 Type 2 diabetes mellitus with hypoglycemia without coma: Secondary | ICD-10-CM

## 2022-09-14 DIAGNOSIS — F909 Attention-deficit hyperactivity disorder, unspecified type: Secondary | ICD-10-CM

## 2022-09-14 DIAGNOSIS — E782 Mixed hyperlipidemia: Secondary | ICD-10-CM | POA: Diagnosis not present

## 2022-09-14 DIAGNOSIS — Z6841 Body Mass Index (BMI) 40.0 and over, adult: Secondary | ICD-10-CM

## 2022-09-14 DIAGNOSIS — R600 Localized edema: Secondary | ICD-10-CM

## 2022-09-14 NOTE — Assessment & Plan Note (Signed)
Worsening HGa1C  Advised pt to check daily fasting glucose , goal discussed <140  Advised pt to eat low calorie healthy snacks every 2-3 hours to avoid sugar dips  Continue to work on exercise routine and focus on a diabetic diet.

## 2022-09-14 NOTE — Progress Notes (Signed)
Established Patient Office Visit  Subjective:      CC:  Chief Complaint  Patient presents with   Medical Management of Chronic Issues    HPI: Brandy Christensen is a 47 y.o. female presenting on 09/14/2022 for Medical Management of Chronic Issues . HTN: average at home 130/80 , states since she stopped the HCTZ that she no longer has pedal edema. She also stopped the amlodipine one week later. She is taking valsartan 80 mg once daily. Denies cp palp or sob.   DM2: just started exercise. Doesn't eat a lot of sugars, does not drink sugars. Trying to watch her carbs. Pt states she has not started farxiga because she feels as though sugars are increased due to gluten which she cut out two weeks ago. States is not checking glucose at home, but will get some strips for her glucometer and start checking.   Obesity: started walking a few weeks ago, starting to walk 30 minutes  She wasn't exercising   Wt Readings from Last 3 Encounters:  09/14/22 (!) 324 lb 9.6 oz (147.2 kg)  08/14/22 (!) 324 lb (147 kg)  08/04/22 (!) 326 lb 12.8 oz (148.2 kg)   Recently took away wheat in diet about two weeks notices that gluten gives her discomfort at times.     Social history:  Relevant past medical, surgical, family and social history reviewed and updated as indicated. Interim medical history since our last visit reviewed.  Allergies and medications reviewed and updated.  DATA REVIEWED: CHART IN EPIC     ROS: Negative unless specifically indicated above in HPI.    Current Outpatient Medications:    Albuterol Sulfate (PROAIR RESPICLICK) 123XX123 (90 Base) MCG/ACT AEPB, 1 puff as needed Inhalation every 4 hrs, Disp: , Rfl:    amphetamine-dextroamphetamine (ADDERALL XR) 25 MG 24 hr capsule, Take 1 capsule by mouth every morning., Disp: , Rfl: 0   EPINEPHrine 0.3 mg/0.3 mL IJ SOAJ injection, INJECT INTO OUTER THIGH FOR SEVERE ALLERGIC REACTION. CALL 911 AFTER USE. MAY SUBSTITUTE ANY BRAND, Disp: ,  Rfl:    levocetirizine (XYZAL) 5 MG tablet, Take 1 tablet (5 mg total) by mouth every evening., Disp: 90 tablet, Rfl: 1   Magnesium 400 MG TABS, 1 tablet with a meal Orally Once a day, Disp: , Rfl:    metFORMIN (GLUCOPHAGE-XR) 500 MG 24 hr tablet, Take two tablets po bid, Disp: 120 tablet, Rfl: 5   Multiple Vitamin (MULTIVITAMIN) tablet, Take 1 tablet by mouth daily., Disp: , Rfl:    nortriptyline (PAMELOR) 25 MG capsule, Take 25 mg by mouth at bedtime., Disp: , Rfl:    Omega-3 Fatty Acids (FISH OIL) 1200 MG CAPS, Take 1,200 mg by mouth daily., Disp: , Rfl:    valsartan (DIOVAN) 80 MG tablet, Take 80 mg by mouth daily., Disp: , Rfl:       Objective:    BP 132/84 Comment: left  Pulse 94   Temp 97.8 F (36.6 C) (Temporal)   Ht '5\' 3"'$  (1.6 m)   Wt (!) 324 lb 9.6 oz (147.2 kg)   SpO2 98%   BMI 57.50 kg/m   Wt Readings from Last 3 Encounters:  09/14/22 (!) 324 lb 9.6 oz (147.2 kg)  08/14/22 (!) 324 lb (147 kg)  08/04/22 (!) 326 lb 12.8 oz (148.2 kg)    Physical Exam Constitutional:      General: She is not in acute distress.    Appearance: Normal appearance. She is obese. She is not  ill-appearing, toxic-appearing or diaphoretic.  HENT:     Head: Normocephalic.  Cardiovascular:     Rate and Rhythm: Normal rate and regular rhythm.  Pulmonary:     Effort: Pulmonary effort is normal.  Musculoskeletal:        General: Normal range of motion.     Right lower leg: 1+ Edema present.  Skin:    Comments: Slight dermatitis with erythema bil anterior shins, very mild  Neurological:     General: No focal deficit present.     Mental Status: She is alert and oriented to person, place, and time. Mental status is at baseline.  Psychiatric:        Mood and Affect: Mood normal.        Behavior: Behavior normal.        Thought Content: Thought content normal.        Judgment: Judgment normal.           Assessment & Plan:  Type 2 diabetes mellitus with hyperglycemia, without long-term  current use of insulin (HCC) -     Hemoglobin A1c; Future  Mixed hyperlipidemia -     Lipid panel; Future  Pedal edema Assessment & Plan: Improving with d/c amlodipine and hctz     Elevated triglycerides with high cholesterol Assessment & Plan: Continue omega 3 fish oils  Will repeat in two months, fasting. Handout given to pt on low cholesterol diet.   Attention deficit hyperactivity disorder (ADHD), unspecified ADHD type Assessment & Plan: Stable Pt following with psychiatry regularly    Class 3 severe obesity due to excess calories with serious comorbidity and body mass index (BMI) of 50.0 to 59.9 in adult [facility]) Assessment & Plan: Long d/w pt on diet and need for focus on dietary intake, consider counting daily calories and continue working on new exercise regimen.    Uncontrolled type 2 diabetes mellitus with hypoglycemia without coma ([place]) Assessment & Plan: Worsening HGa1C  Advised pt to check daily fasting glucose , goal discussed <140  Advised pt to eat low calorie healthy snacks every 2-3 hours to avoid sugar dips  Continue to work on exercise routine and focus on a diabetic diet.      Return in about 6 months (around 03/15/2023) for f/u diabetes.  Eugenia Pancoast, MSN, APRN, FNP-C [place]

## 2022-09-14 NOTE — Assessment & Plan Note (Signed)
Continue omega 3 fish oils  Will repeat in two months, fasting. Handout given to pt on low cholesterol diet.

## 2022-09-14 NOTE — Assessment & Plan Note (Signed)
Stable Pt following with psychiatry regularly

## 2022-09-14 NOTE — Assessment & Plan Note (Signed)
Long d/w pt on diet and need for focus on dietary intake, consider counting daily calories and continue working on new exercise regimen.

## 2022-09-14 NOTE — Assessment & Plan Note (Signed)
Improving with d/c amlodipine and hctz

## 2022-11-09 ENCOUNTER — Telehealth: Payer: Self-pay | Admitting: Family

## 2022-11-09 DIAGNOSIS — E119 Type 2 diabetes mellitus without complications: Secondary | ICD-10-CM

## 2022-11-09 MED ORDER — METFORMIN HCL ER 500 MG PO TB24: ORAL_TABLET | ORAL | 5 refills | Status: DC

## 2022-11-09 NOTE — Telephone Encounter (Signed)
  metFORMIN (GLUCOPHAGE-XR) 500 MG 24 hr tablet   LR- 04/17/22 ( 120 tabs/ 5 refills) LV- 09/14/22 NV- 11/14/22  Pt is aware meds was sent to pharmacy.

## 2022-11-09 NOTE — Telephone Encounter (Signed)
Prescription Request  11/09/2022  LOV: 09/14/2022  What is the name of the medication or equipment? metFORMIN (GLUCOPHAGE-XR) 500 MG 24 hr tablet [[id number]]   Have you contacted your pharmacy to request a refill? Yes  Pt states she was instructed to contact office for refill   Which pharmacy would you like this sent to?  [facility] [address] ([address]), [place] - [address] [place]) [place] [address] Phone: [phone] Fax: [phone]    Patient notified that their request is being sent to the clinical staff for review and that they should receive a response within 2 business days.   Please advise at Mobile [phone] (mobile)  Pt states she is almost out of meds, needs refill before Monday, 4/29.

## 2022-11-14 ENCOUNTER — Other Ambulatory Visit (INDEPENDENT_AMBULATORY_CARE_PROVIDER_SITE_OTHER): Payer: 59

## 2022-11-14 ENCOUNTER — Other Ambulatory Visit: Payer: Self-pay | Admitting: Family

## 2022-11-14 DIAGNOSIS — E11649 Type 2 diabetes mellitus with hypoglycemia without coma: Secondary | ICD-10-CM | POA: Diagnosis not present

## 2022-11-14 DIAGNOSIS — E782 Mixed hyperlipidemia: Secondary | ICD-10-CM

## 2022-11-14 NOTE — Telephone Encounter (Signed)
Refill for Albuterol Sulfate (PROAIR RESPICLICK) 108 (90 Base) MCG/ACT AEPB   LV- 09/14/22 LR- 08/14/22 NV- 03/15/23

## 2022-11-14 NOTE — Telephone Encounter (Signed)
Prescription Request  11/14/2022  LOV: 09/14/2022  What is the name of the medication or equipment? Albuterol Sulfate (PROAIR RESPICLICK) 108 (90 Base) MCG/ACT AEPB   Have you contacted your pharmacy to request a refill? No   Which pharmacy would you like this sent to?  [facility] [address] ([address]),  - [address] [name] Phone: [phone] Fax: [phone]    Patient notified that their request is being sent to the clinical staff for review and that they should receive a response within 2 business days.   Please advise at Mobile [phone] (mobile)

## 2022-11-15 ENCOUNTER — Other Ambulatory Visit: Payer: Self-pay | Admitting: Family

## 2022-11-15 DIAGNOSIS — E11649 Type 2 diabetes mellitus with hypoglycemia without coma: Secondary | ICD-10-CM

## 2022-11-15 LAB — LIPID PANEL
Cholesterol: 179 mg/dL (ref 0–200)
HDL: 37 mg/dL — ABNORMAL LOW (ref >39.00)
NonHDL: 141.65
Total CHOL/HDL Ratio: 5
Triglycerides: 210 mg/dL — ABNORMAL HIGH (ref 0.0–149.0)
VLDL: 42 mg/dL — ABNORMAL HIGH (ref 0.0–40.0)

## 2022-11-15 LAB — LDL CHOLESTEROL, DIRECT: Direct LDL: 121 mg/dL

## 2022-11-15 LAB — HEMOGLOBIN A1C: Hgb A1c MFr Bld: 7.1 % — ABNORMAL HIGH (ref 4.6–6.5)

## 2022-11-15 MED ORDER — EMPAGLIFLOZIN 10 MG PO TABS
10.0000 mg | ORAL_TABLET | Freq: Every day | ORAL | 3 refills | Status: DC
Start: 2022-11-15 — End: 2023-03-15

## 2022-11-15 MED ORDER — ALBUTEROL SULFATE 108 (90 BASE) MCG/ACT IN AEPB: INHALATION_SPRAY | RESPIRATORY_TRACT | 2 refills | Status: DC

## 2022-11-15 NOTE — Progress Notes (Signed)
Triglycerides have improvement some. Still at 210 but better from 303 three months ago. However LDL (bad cholesterol) has increased from 103 to 121 (goal <70). I do want to highly recommend starting lipitor, a statin to control these numbers. Long term elevation of cholesterol can result in aortic stenosis which is plaquing/calcifications of the arteries.   Diabetic number improved slightly as well, from 7.5 to 7.1  I want to add on another agent called jardiance which will help with diabetes as well as protect the kidneys and reduce your risk of heart disease.

## 2022-12-07 ENCOUNTER — Other Ambulatory Visit (HOSPITAL_COMMUNITY): Payer: Self-pay

## 2023-02-14 ENCOUNTER — Encounter (INDEPENDENT_AMBULATORY_CARE_PROVIDER_SITE_OTHER): Payer: Self-pay

## 2023-03-15 ENCOUNTER — Encounter: Payer: Self-pay | Admitting: Family

## 2023-03-15 ENCOUNTER — Ambulatory Visit (INDEPENDENT_AMBULATORY_CARE_PROVIDER_SITE_OTHER): Payer: 59 | Admitting: Family

## 2023-03-15 ENCOUNTER — Other Ambulatory Visit: Payer: Self-pay | Admitting: Family

## 2023-03-15 VITALS — BP 130/85 | HR 78 | Temp 98.3°F | Ht 63.0 in | Wt 317.0 lb

## 2023-03-15 DIAGNOSIS — E11649 Type 2 diabetes mellitus with hypoglycemia without coma: Secondary | ICD-10-CM

## 2023-03-15 DIAGNOSIS — Z7984 Long term (current) use of oral hypoglycemic drugs: Secondary | ICD-10-CM

## 2023-03-15 DIAGNOSIS — J4530 Mild persistent asthma, uncomplicated: Secondary | ICD-10-CM

## 2023-03-15 DIAGNOSIS — E559 Vitamin D deficiency, unspecified: Secondary | ICD-10-CM

## 2023-03-15 DIAGNOSIS — E782 Mixed hyperlipidemia: Secondary | ICD-10-CM

## 2023-03-15 DIAGNOSIS — F909 Attention-deficit hyperactivity disorder, unspecified type: Secondary | ICD-10-CM

## 2023-03-15 LAB — LDL CHOLESTEROL, DIRECT: Direct LDL: 148 mg/dL

## 2023-03-15 LAB — HEMOGLOBIN A1C: Hgb A1c MFr Bld: 7.2 % — ABNORMAL HIGH (ref 4.6–6.5)

## 2023-03-15 LAB — LIPID PANEL
Cholesterol: 198 mg/dL (ref 0–200)
HDL: 36.9 mg/dL — ABNORMAL LOW (ref >39.00)
NonHDL: 161.45
Total CHOL/HDL Ratio: 5
Triglycerides: 330 mg/dL — ABNORMAL HIGH (ref 0.0–149.0)
VLDL: 66 mg/dL — ABNORMAL HIGH (ref 0.0–40.0)

## 2023-03-15 MED ORDER — ALBUTEROL SULFATE HFA 108 (90 BASE) MCG/ACT IN AERS
2.0000 | INHALATION_SPRAY | Freq: Four times a day (QID) | RESPIRATORY_TRACT | 2 refills | Status: DC | PRN
Start: 2023-03-15 — End: 2023-11-01

## 2023-03-15 MED ORDER — EMPAGLIFLOZIN 10 MG PO TABS
10.0000 mg | ORAL_TABLET | Freq: Every day | ORAL | 3 refills | Status: DC
Start: 2023-03-15 — End: 2023-11-01

## 2023-03-15 NOTE — Assessment & Plan Note (Signed)
Ordered hga1c today pending results. Work on diabetic diet and exercise as tolerated. Yearly foot exam, and annual eye exam. Declined diabetic nutritional referral.

## 2023-03-15 NOTE — Progress Notes (Signed)
Established Patient Office Visit  Subjective:      CC:  Chief Complaint  Patient presents with   Diabetes    HPI: Brandy Christensen is a 47 y.o. female presenting on 03/15/2023 for Diabetes .     Wt Readings from Last 3 Encounters:  03/15/23 (!) 317 lb (143.8 kg)  09/14/22 (!) 324 lb 9.6 oz (147.2 kg)  08/14/22 (!) 324 lb (147 kg)   HTN: today 140/86, running at home around 130 over 70 . On valsartan 80 mg once daily.   Allergies: xyzal 5 mg once daily. Does also see allergist has epi pen.   ADHD: doing well on adderall XR 25 no chest pain no sob.   Diabetes:  metformin XR 500 mg bid , she states she has not started jardiance bc she is hesitant given her h/o anaphlyaxis to GLP 1 . Fasting glucose 100-130 on average  Lab Results  Component Value Date   HGBA1C 7.1 (H) 11/14/2022   Asthma: needs refill on albuterol. Doing well 'hardly uses it'        Social history:  Relevant past medical, surgical, family and social history reviewed and updated as indicated. Interim medical history since our last visit reviewed.  Allergies and medications reviewed and updated.  DATA REVIEWED: CHART IN EPIC     ROS: Negative unless specifically indicated above in HPI.    Current Outpatient Medications:    albuterol (VENTOLIN HFA) 108 (90 Base) MCG/ACT inhaler, Inhale 2 puffs into the lungs every 6 (six) hours as needed for wheezing or shortness of breath., Disp: 6.7 g, Rfl: 2   amphetamine-dextroamphetamine (ADDERALL XR) 25 MG 24 hr capsule, Take 1 capsule by mouth every morning., Disp: , Rfl: 0   empagliflozin (JARDIANCE) 10 MG TABS tablet, Take 1 tablet (10 mg total) by mouth daily before breakfast., Disp: 90 tablet, Rfl: 3   EPINEPHrine 0.3 mg/0.3 mL IJ SOAJ injection, INJECT INTO OUTER THIGH FOR SEVERE ALLERGIC REACTION. CALL 911 AFTER USE. MAY SUBSTITUTE ANY BRAND, Disp: , Rfl:    levocetirizine (XYZAL) 5 MG tablet, Take 1 tablet (5 mg total) by mouth every evening.,  Disp: 90 tablet, Rfl: 1   Magnesium 400 MG TABS, 1 tablet with a meal Orally Once a day, Disp: , Rfl:    metFORMIN (GLUCOPHAGE-XR) 500 MG 24 hr tablet, Take two tablets po bid, Disp: 120 tablet, Rfl: 5   Multiple Vitamin (MULTIVITAMIN) tablet, Take 1 tablet by mouth daily., Disp: , Rfl:    nortriptyline (PAMELOR) 25 MG capsule, Take 25 mg by mouth at bedtime., Disp: , Rfl:    Omega-3 Fatty Acids (FISH OIL) 1200 MG CAPS, Take 1,200 mg by mouth daily., Disp: , Rfl:    valsartan (DIOVAN) 80 MG tablet, Take 80 mg by mouth daily., Disp: , Rfl:       Objective:    BP 130/85   Pulse 78   Temp 98.3 F (36.8 C) (Oral)   Ht 5\' 3"  (1.6 m)   Wt (!) 317 lb (143.8 kg)   SpO2 98%   BMI 56.15 kg/m   Wt Readings from Last 3 Encounters:  03/15/23 (!) 317 lb (143.8 kg)  09/14/22 (!) 324 lb 9.6 oz (147.2 kg)  08/14/22 (!) 324 lb (147 kg)    Physical Exam Constitutional:      General: She is not in acute distress.    Appearance: Normal appearance. She is obese. She is not ill-appearing.  HENT:     Head: Normocephalic.  Right Ear: Tympanic membrane normal.     Left Ear: Tympanic membrane normal.     Nose: Nose normal.     Mouth/Throat:     Mouth: Mucous membranes are moist.  Eyes:     Extraocular Movements: Extraocular movements intact.     Pupils: Pupils are equal, round, and reactive to light.  Cardiovascular:     Rate and Rhythm: Normal rate and regular rhythm.  Pulmonary:     Effort: Pulmonary effort is normal.     Breath sounds: Normal breath sounds.  Abdominal:     General: Abdomen is flat. Bowel sounds are normal.     Palpations: Abdomen is soft.     Tenderness: There is no guarding or rebound.  Musculoskeletal:        General: Normal range of motion.     Cervical back: Normal range of motion.  Skin:    General: Skin is warm.     Capillary Refill: Capillary refill takes less than 2 seconds.  Neurological:     General: No focal deficit present.     Mental Status: She is  alert.  Psychiatric:        Mood and Affect: Mood normal.        Behavior: Behavior normal.        Thought Content: Thought content normal.        Judgment: Judgment normal.           Assessment & Plan:  Attention deficit hyperactivity disorder (ADHD), unspecified ADHD type Assessment & Plan: Stable. Pdmp reviewed. Continue adderall 30 mg XL    Vitamin D deficiency  Uncontrolled type 2 diabetes mellitus with hypoglycemia without coma [facility]) Assessment & Plan: Ordered hga1c today pending results. Work on diabetic diet and exercise as tolerated. Yearly foot exam, and annual eye exam. Declined diabetic nutritional referral.    Orders: -     Hemoglobin A1c  Mixed hyperlipidemia Assessment & Plan: Ordered lipid panel, pending results. Work on low cholesterol diet and exercise as tolerated   Orders: -     Lipid panel  Mild persistent asthma, unspecified whether complicated -     Albuterol Sulfate HFA; Inhale 2 puffs into the lungs every 6 (six) hours as needed for wheezing or shortness of breath.  Dispense: 6.7 g; Refill: 2     Return in about 6 months (around 09/14/2023) for f/u diabetes.  Mort Sawyers, MSN, APRN, FNP-C [facility]

## 2023-03-15 NOTE — Assessment & Plan Note (Signed)
Ordered lipid panel, pending results. Work on low cholesterol diet and exercise as tolerated  

## 2023-03-15 NOTE — Assessment & Plan Note (Signed)
Stable. Pdmp reviewed. Continue adderall 30 mg XL

## 2023-03-21 ENCOUNTER — Encounter: Payer: Self-pay | Admitting: Family

## 2023-04-04 ENCOUNTER — Telehealth: Payer: Self-pay | Admitting: Family

## 2023-04-04 NOTE — Telephone Encounter (Signed)
Noted.  Please let pt know in the future, she will need to get her pulmonologist to refill her albuterol just so that the refills are in one place and the pulmonologist can manage the frequency of of use.

## 2023-04-04 NOTE — Telephone Encounter (Signed)
Received notification that pt has not viewed Tabitha's most recent MyChart message >>  From Mort Sawyers, FNP To [place], [place] Sent and Delivered 03/21/2023  8:18 AM     Good morning Mitzi Davenport! Your insurance reached out to me because it looks as though you have had to refill your albuterol just about monthly so they are concerned your asthma is not controlled.   How often are you having to use your inhaler?  Are you waking up at night with shortness of breath?  Is it exercise induced?    ------------------------------------- Spoke with pt. States that she is using her albuterol inhaler daily when she exercises per her Pulmonologist recommendation. She refilled the prescription twice so that she would have "back up" inhalers around her house. Denies waking up with shortness of breath.

## 2023-04-26 ENCOUNTER — Other Ambulatory Visit: Payer: Self-pay | Admitting: Family

## 2023-04-26 DIAGNOSIS — E119 Type 2 diabetes mellitus without complications: Secondary | ICD-10-CM

## 2023-07-26 ENCOUNTER — Other Ambulatory Visit: Payer: Self-pay | Admitting: Family

## 2023-07-26 DIAGNOSIS — I10 Essential (primary) hypertension: Secondary | ICD-10-CM

## 2023-10-01 IMAGING — MR MR ABDOMEN WO/W CM MRCP
19 of 21 series · 44 of 48 positions shown · IV contrast (gadavist)
Comparison: None.

CLINICAL DATA: History of pancreatitis

EXAM:
MRI ABDOMEN WITHOUT AND WITH CONTRAST (INCLUDING MRCP)
TECHNIQUE: Multiplanar multisequence MR imaging of the abdomen was performed
both before and after the administration of intravenous contrast.
Heavily T2-weighted images of the biliary and pancreatic ducts were
obtained, and three-dimensional MRCP images were rendered by post
processing.
CONTRAST:  10mL GADAVIST GADOBUTROL 1 MMOL/ML IV SOLN

[Series 4: T2 · coronal · 6.5mm · 1.31mm/px · 1 of 32 slices shown (1 of 2)]
[im 1/32]
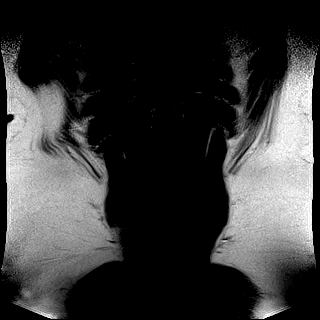

[Series 6: T1 · axial · 3.0mm · 1.19mm/px · z∈[-70,+191]mm · 2 of 88 slices shown (1 of 2)]
[im 1/88]
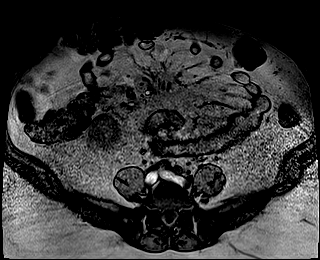
[im 88/88]
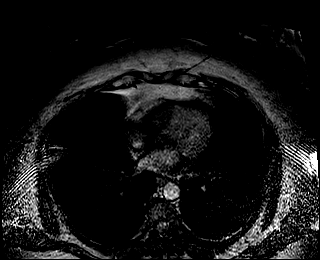

[Series 7: T1 · axial · 3.0mm · 1.19mm/px · z∈[-70,+191]mm · 3 of 88 slices shown (2 of 2)]
[im 1/88]
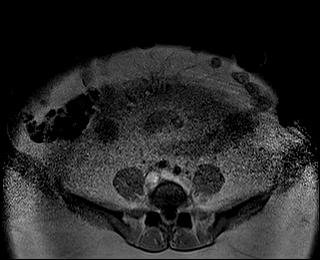
[im 44/88]
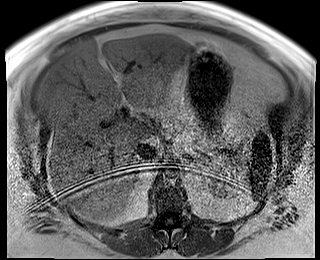
[im 88/88]
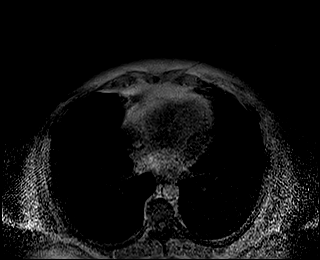

[Series 8: T2 · axial · 6.5mm · 1.31mm/px · 1 of 37 slices shown (2 of 2)]
[im 1/37]
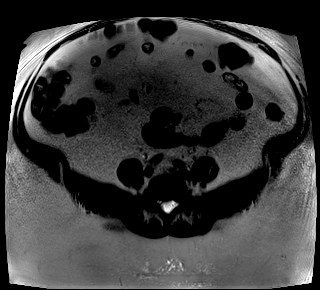

[Series 9: T2 fat-sat · axial · 6.5mm · 1.31mm/px · 1 of 37 slices shown]
[im 1/37]
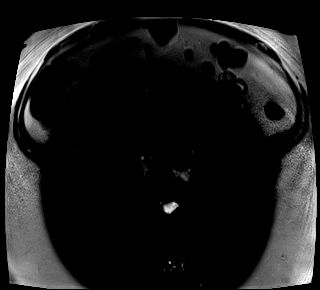

[Series 10: ax dwi_tracew · axial · 6.0mm · 1.87mm/px · z∈[-73,+193]mm · 4 of 114 slices shown]
[im 1/114]
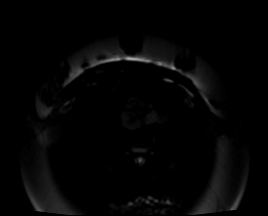
[im 38/114]
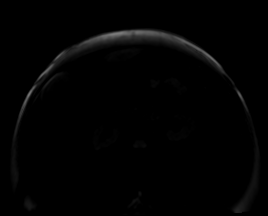
[im 76/114]
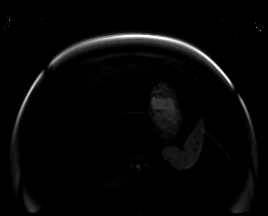
[im 114/114]
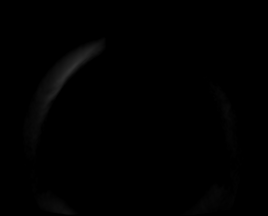

[Series 11: ax dwi_adc · axial · 6.0mm · 1.87mm/px · 1 of 38 slices shown]
[im 1/38]
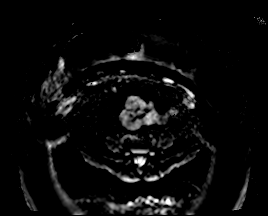

[Series 15: MRCP · coronal · 3.0mm · 1.56mm/px · 1 of 23 slices shown]
[im 1/23]
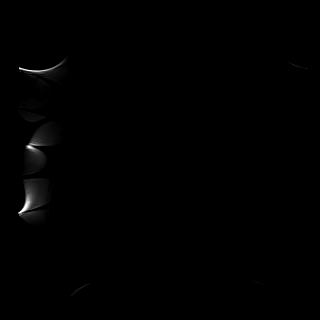

[Series 16: radials · coronal · 50.0mm · 1.30mm/px · 1 of 3 slices shown]
[im 1/3]
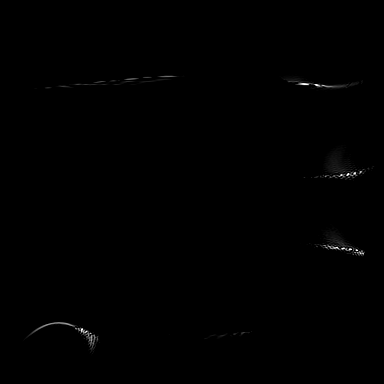

[Series 20: T1 dynamic fat-sat · axial · 3.0mm · 1.56mm/px · z∈[-70,+191]mm · 3 of 88 slices shown (1 of 9)]
[im 1/88]
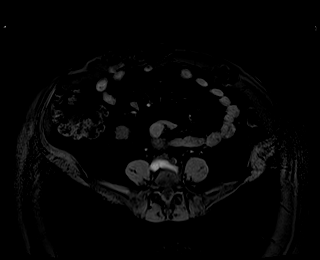
[im 44/88]
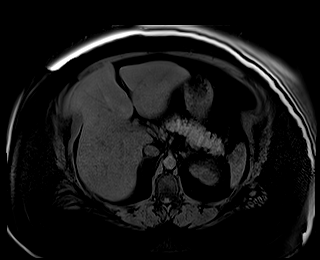
[im 88/88]
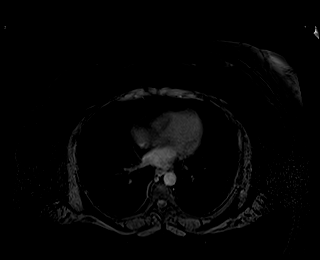

[Series 23: T1 dynamic fat-sat · axial · 3.0mm · 1.56mm/px · z∈[-70,+191]mm · 3 of 88 slices shown (2 of 9)]
[im 1/88]
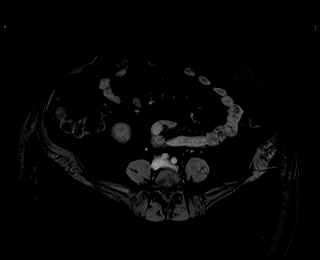
[im 44/88]
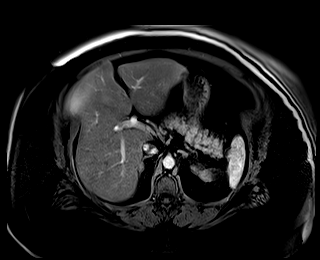
[im 88/88]
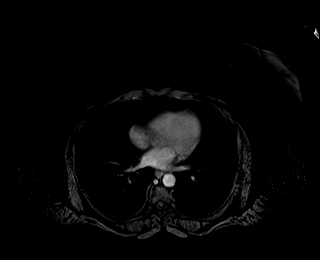

[Series 24: T1 dynamic fat-sat · axial · 3.0mm · 1.56mm/px · z∈[-70,+191]mm · 3 of 88 slices shown (3 of 9)]
[im 1/88]
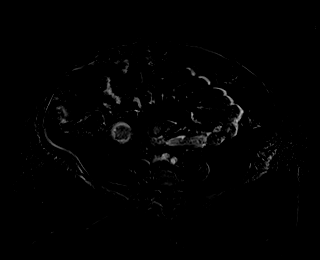
[im 44/88]
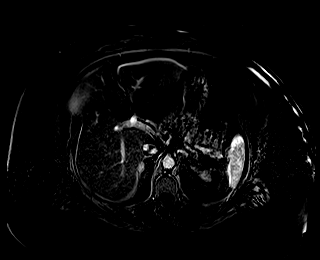
[im 88/88]
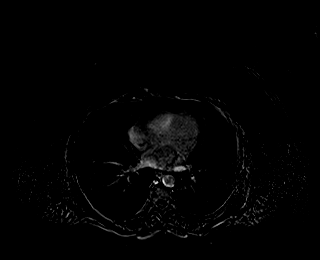

[Series 27: T1 dynamic fat-sat · axial · 3.0mm · 1.56mm/px · z∈[-70,+191]mm · 3 of 88 slices shown (4 of 9)]
[im 1/88]
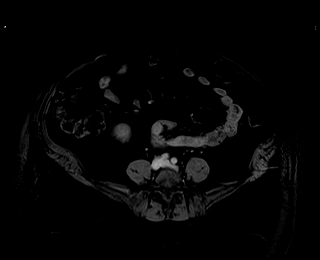
[im 44/88]
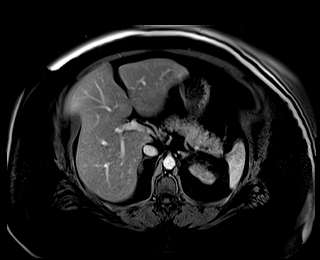
[im 88/88]
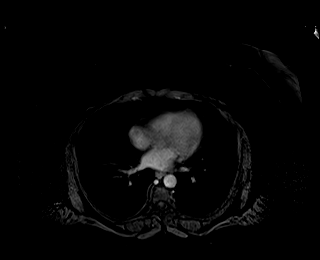

[Series 28: T1 dynamic fat-sat · axial · 3.0mm · 1.56mm/px · z∈[-70,+191]mm · 3 of 88 slices shown (5 of 9)]
[im 1/88]
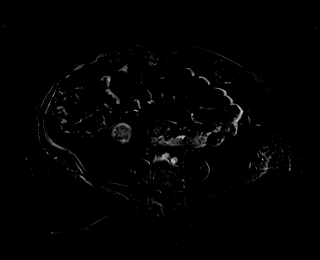
[im 44/88]
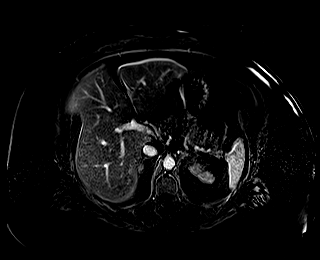
[im 88/88]
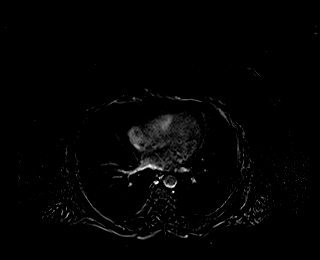

[Series 31: T1 dynamic fat-sat · axial · 3.0mm · 1.56mm/px · z∈[-70,+191]mm · 3 of 88 slices shown (6 of 9)]
[im 1/88]
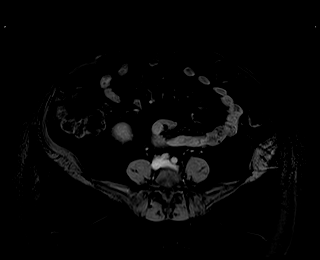
[im 44/88]
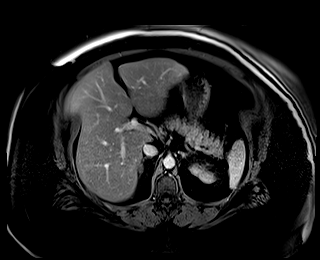
[im 88/88]
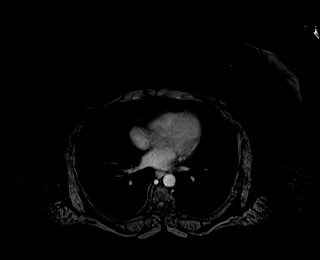

[Series 32: T1 dynamic fat-sat · axial · 3.0mm · 1.56mm/px · z∈[-70,+191]mm · 3 of 88 slices shown (7 of 9)]
[im 1/88]
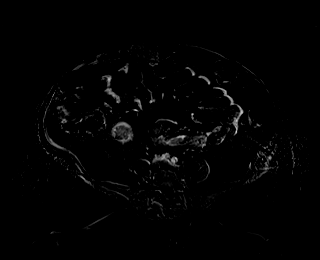
[im 44/88]
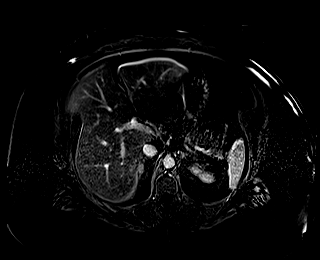
[im 88/88]
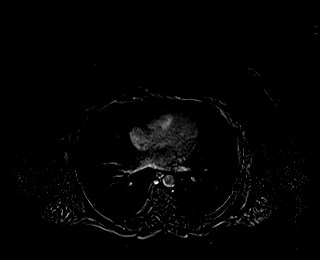

[Series 33: T1 dynamic post-contrast · coronal · 3.5mm · 1.56mm/px · 2 of 72 slices shown]
[im 1/72]
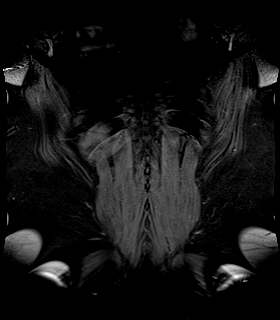
[im 72/72]
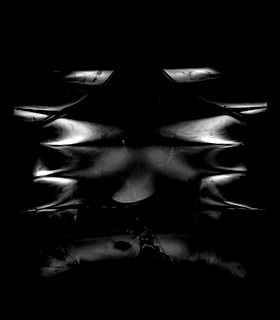

[Series 36: T1 dynamic fat-sat · axial · 3.0mm · 1.56mm/px · z∈[-70,+191]mm · 3 of 88 slices shown (8 of 9)]
[im 1/88]
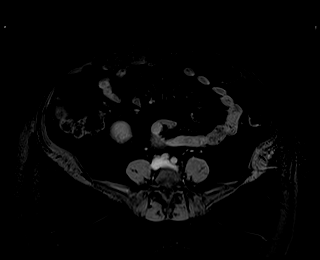
[im 44/88]
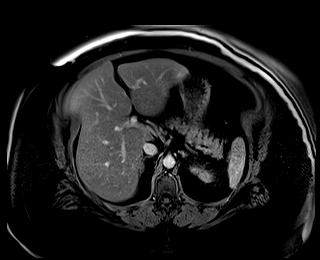
[im 88/88]
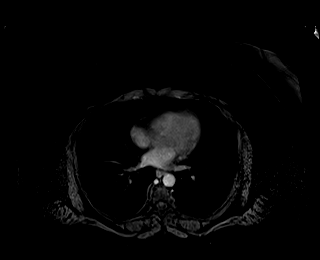

[Series 37: T1 dynamic fat-sat · axial · 3.0mm · 1.56mm/px · z∈[-70,+191]mm · 3 of 88 slices shown (9 of 9)]
[im 1/88]
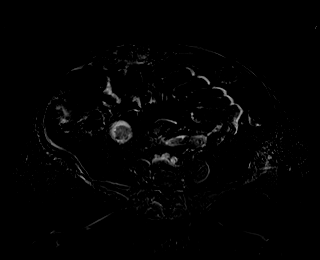
[im 44/88]
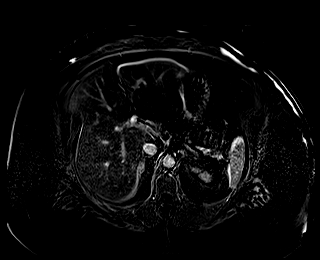
[im 88/88]
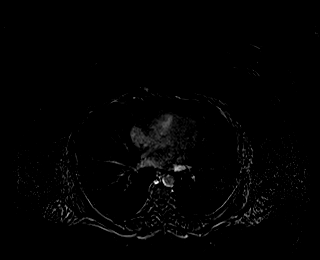

[44 of 48 positions shown; findings below may reference images not displayed]

FINDINGS: Somewhat limited due to motion and body habitus.

Lower chest: No acute findings.

Hepatobiliary: Liver is enlarged measuring 21.5 cm in length, with
evidence of diffuse hepatic steatosis. No suspicious hepatic mass
visualized. Gallbladder appears normal. No biliary ductal
dilatation.

Pancreas: No mass, inflammatory changes, or other parenchymal
abnormality identified. No ductal dilatation.

Spleen:  Within normal limits in size and appearance.

Adrenals/Urinary Tract: 1.2 cm right adrenal gland adenoma. Left
adrenal gland is normal. Bilateral kidneys appear normal.

Stomach/Bowel: Visualized portions within the abdomen are
unremarkable.

Vascular/Lymphatic: No pathologically enlarged lymph nodes
identified. No abdominal aortic aneurysm demonstrated.

Other:  No ascites.

Musculoskeletal: No suspicious bone lesions identified.
IMPRESSION: 1. Normal appearance of the pancreas.
2. Hepatomegaly and hepatic steatosis.
3. Small right adrenal gland benign adenoma.

## 2023-10-26 ENCOUNTER — Other Ambulatory Visit: Payer: Self-pay | Admitting: Family

## 2023-10-26 DIAGNOSIS — Z1231 Encounter for screening mammogram for malignant neoplasm of breast: Secondary | ICD-10-CM

## 2023-10-26 DIAGNOSIS — I10 Essential (primary) hypertension: Secondary | ICD-10-CM

## 2023-10-26 NOTE — Telephone Encounter (Signed)
 Please let pt know she is overdue for appt She was due in feb for f/u.  Needs appt for future refills.

## 2023-10-26 NOTE — Telephone Encounter (Signed)
 Patient scheduled.

## 2023-10-26 NOTE — Telephone Encounter (Signed)
 Lvmtcb, sent mychart message

## 2023-10-27 ENCOUNTER — Other Ambulatory Visit: Payer: Self-pay | Admitting: Family

## 2023-10-27 DIAGNOSIS — E119 Type 2 diabetes mellitus without complications: Secondary | ICD-10-CM

## 2023-11-01 ENCOUNTER — Ambulatory Visit (INDEPENDENT_AMBULATORY_CARE_PROVIDER_SITE_OTHER): Admitting: Family

## 2023-11-01 ENCOUNTER — Encounter: Payer: Self-pay | Admitting: Family

## 2023-11-01 VITALS — BP 132/70 | HR 90 | Temp 97.9°F | Ht 63.0 in | Wt 320.4 lb

## 2023-11-01 DIAGNOSIS — F901 Attention-deficit hyperactivity disorder, predominantly hyperactive type: Secondary | ICD-10-CM

## 2023-11-01 DIAGNOSIS — J4530 Mild persistent asthma, uncomplicated: Secondary | ICD-10-CM

## 2023-11-01 DIAGNOSIS — I10 Essential (primary) hypertension: Secondary | ICD-10-CM | POA: Diagnosis not present

## 2023-11-01 DIAGNOSIS — E559 Vitamin D deficiency, unspecified: Secondary | ICD-10-CM | POA: Diagnosis not present

## 2023-11-01 DIAGNOSIS — E66813 Obesity, class 3: Secondary | ICD-10-CM

## 2023-11-01 DIAGNOSIS — E782 Mixed hyperlipidemia: Secondary | ICD-10-CM

## 2023-11-01 DIAGNOSIS — L409 Psoriasis, unspecified: Secondary | ICD-10-CM

## 2023-11-01 DIAGNOSIS — L03115 Cellulitis of right lower limb: Secondary | ICD-10-CM

## 2023-11-01 DIAGNOSIS — E11649 Type 2 diabetes mellitus with hypoglycemia without coma: Secondary | ICD-10-CM | POA: Diagnosis not present

## 2023-11-01 DIAGNOSIS — Z7984 Long term (current) use of oral hypoglycemic drugs: Secondary | ICD-10-CM

## 2023-11-01 DIAGNOSIS — Z6841 Body Mass Index (BMI) 40.0 and over, adult: Secondary | ICD-10-CM

## 2023-11-01 LAB — COMPREHENSIVE METABOLIC PANEL WITH GFR
ALT: 23 U/L (ref 0–35)
AST: 14 U/L (ref 0–37)
Albumin: 4.3 g/dL (ref 3.5–5.2)
Alkaline Phosphatase: 58 U/L (ref 39–117)
BUN: 11 mg/dL (ref 6–23)
CO2: 32 meq/L (ref 19–32)
Calcium: 9.4 mg/dL (ref 8.4–10.5)
Chloride: 98 meq/L (ref 96–112)
Creatinine, Ser: 0.62 mg/dL (ref 0.40–1.20)
GFR: 105.04 mL/min (ref >60.00)
Glucose, Bld: 131 mg/dL — ABNORMAL HIGH (ref 70–99)
Potassium: 4.4 meq/L (ref 3.5–5.1)
Sodium: 136 meq/L (ref 135–145)
Total Bilirubin: 0.4 mg/dL (ref 0.2–1.2)
Total Protein: 6.8 g/dL (ref 6.0–8.3)

## 2023-11-01 LAB — LIPID PANEL
Cholesterol: 189 mg/dL (ref 0–200)
HDL: 39.4 mg/dL (ref >39.00)
LDL Cholesterol: 106 mg/dL — ABNORMAL HIGH (ref 0–99)
NonHDL: 149.33
Total CHOL/HDL Ratio: 5
Triglycerides: 215 mg/dL — ABNORMAL HIGH (ref 0.0–149.0)
VLDL: 43 mg/dL — ABNORMAL HIGH (ref 0.0–40.0)

## 2023-11-01 LAB — VITAMIN D 25 HYDROXY (VIT D DEFICIENCY, FRACTURES): VITD: 58.34 ng/mL (ref 30.00–100.00)

## 2023-11-01 LAB — MICROALBUMIN / CREATININE URINE RATIO
Creatinine,U: 35.9 mg/dL
Microalb Creat Ratio: UNDETERMINED mg/g (ref 0.0–30.0)
Microalb, Ur: <0.7 mg/dL

## 2023-11-01 LAB — HEMOGLOBIN A1C: Hgb A1c MFr Bld: 7.2 % — ABNORMAL HIGH (ref 4.6–6.5)

## 2023-11-01 MED ORDER — CEPHALEXIN 500 MG PO CAPS: 500.0000 mg | ORAL_CAPSULE | Freq: Three times a day (TID) | ORAL | 0 refills | Status: AC

## 2023-11-01 MED ORDER — ALBUTEROL SULFATE HFA 108 (90 BASE) MCG/ACT IN AERS: 2.0000 | INHALATION_SPRAY | Freq: Four times a day (QID) | RESPIRATORY_TRACT | 0 refills | Status: AC | PRN

## 2023-11-01 MED ORDER — CALCIPOTRIENE 0.005 % EX CREA: TOPICAL_CREAM | Freq: Two times a day (BID) | CUTANEOUS | 0 refills | Status: AC

## 2023-11-01 NOTE — Assessment & Plan Note (Signed)
 Continue valsartan 80 mg  Goal <140/90\ At goal in office

## 2023-11-01 NOTE — Assessment & Plan Note (Signed)
 Continue adderall as prescribed F/u with psychiatry as scheduled

## 2023-11-01 NOTE — Progress Notes (Signed)
 Established Patient Office Visit  Subjective:      CC:  Chief Complaint  Patient presents with   Medication Refill    Patient takes 35mg  of adderall daily, 25 and a 10    HPI: Brandy Christensen is a 48 y.o. female presenting on 11/01/2023 for Medication Refill (Patient takes 35mg  of adderall daily, 25 and a 10) . DM2: metformin XR 500 two tablets twice daily, she did not start jardiance.  At this present movement fasting glucose is 105 she has not eaten this am quite yet. She thinks her adderall is spiking her blood sugar. She has noticed that after taking adderall she has spikes around 150/160  Lab Results  Component Value Date   HGBA1C 7.2 (H) 03/15/2023    ADD: she is with psychiatry and on adderall 35 once daily.   HTN: on valsartan 80 mg once daily.   Redness RLE with erdness and erythema, with a scratch from the cat right lower shin.   Wt Readings from Last 3 Encounters:  11/01/23 (!) 320 lb 6.4 oz (145.3 kg)  03/15/23 (!) 317 lb (143.8 kg)  09/14/22 (!) 324 lb 9.6 oz (147.2 kg)      Social history:  Relevant past medical, surgical, family and social history reviewed and updated as indicated. Interim medical history since our last visit reviewed.  Allergies and medications reviewed and updated.  DATA REVIEWED: CHART IN EPIC     ROS: Negative unless specifically indicated above in HPI.    Current Outpatient Medications:    albuterol (VENTOLIN HFA) 108 (90 Base) MCG/ACT inhaler, Inhale 2 puffs into the lungs every 6 (six) hours as needed for wheezing or shortness of breath., Disp: 6.7 g, Rfl: 2   amphetamine-dextroamphetamine (ADDERALL XR) 10 MG 24 hr capsule, Take 10 mg by mouth daily., Disp: , Rfl:    amphetamine-dextroamphetamine (ADDERALL XR) 25 MG 24 hr capsule, Take 1 capsule by mouth every morning., Disp: , Rfl: 0   calcipotriene (DOVONOX) 0.005 % cream, Apply topically 2 (two) times daily., Disp: 60 g, Rfl: 0   cephALEXin (KEFLEX) 500 MG capsule,  Take 1 capsule (500 mg total) by mouth 3 (three) times daily for 5 days., Disp: 15 capsule, Rfl: 0   EPINEPHrine 0.3 mg/0.3 mL IJ SOAJ injection, INJECT INTO OUTER THIGH FOR SEVERE ALLERGIC REACTION. CALL 911 AFTER USE. MAY SUBSTITUTE ANY BRAND, Disp: , Rfl:    levocetirizine (XYZAL) 5 MG tablet, Take 1 tablet (5 mg total) by mouth every evening., Disp: 90 tablet, Rfl: 1   Magnesium 400 MG TABS, 1 tablet with a meal Orally Once a day, Disp: , Rfl:    metFORMIN (GLUCOPHAGE-XR) 500 MG 24 hr tablet, Take 2 tablets by mouth twice daily, Disp: 120 tablet, Rfl: 0   Multiple Vitamin (MULTIVITAMIN) tablet, Take 1 tablet by mouth daily., Disp: , Rfl:    valsartan (DIOVAN) 80 MG tablet, TAKE 1 TABLET BY MOUTH ONCE DAILY . APPOINTMENT REQUIRED FOR FUTURE REFILLS, Disp: 30 tablet, Rfl: 0   Omega-3 Fatty Acids (FISH OIL) 1200 MG CAPS, Take 1,200 mg by mouth daily. (Patient not taking: Reported on 11/01/2023), Disp: , Rfl:       Objective:    BP 132/70 (BP Location: Left Arm, Patient Position: Sitting)   Pulse 90   Temp 97.9 F (36.6 C) (Temporal)   Ht 5\' 3"  (1.6 m)   Wt (!) 320 lb 6.4 oz (145.3 kg)   SpO2 98%   BMI 56.76 kg/m   Wt  Readings from Last 3 Encounters:  11/01/23 (!) 320 lb 6.4 oz (145.3 kg)  03/15/23 (!) 317 lb (143.8 kg)  09/14/22 (!) 324 lb 9.6 oz (147.2 kg)    Physical Exam Constitutional:      General: She is not in acute distress.    Appearance: Normal appearance. She is normal weight. She is not ill-appearing, toxic-appearing or diaphoretic.  HENT:     Head: Normocephalic.  Cardiovascular:     Rate and Rhythm: Normal rate and regular rhythm.  Pulmonary:     Effort: Pulmonary effort is normal.  Musculoskeletal:        General: Normal range of motion.  Skin:    Comments: RLE erythema with a abrasion   Neurological:     General: No focal deficit present.     Mental Status: She is alert and oriented to person, place, and time. Mental status is at baseline.  Psychiatric:         Mood and Affect: Mood normal.        Behavior: Behavior normal.        Thought Content: Thought content normal.        Judgment: Judgment normal.           Assessment & Plan:  Cellulitis of right lower extremity Assessment & Plan: Rx cephalexin  Pt advised to:  Please monitor site for worsening signs/symptoms of infection to include: increasing redness, increasing tenderness, increase in size, and or pustulant drainage from site. If this is to occur please let me know immediately.    Orders: -     Cephalexin; Take 1 capsule (500 mg total) by mouth 3 (three) times daily for 5 days.  Dispense: 15 capsule; Refill: 0  Uncontrolled type 2 diabetes mellitus with hypoglycemia without coma [facility]) Assessment & Plan: Ordered hga1c today and urine m/a pending results. Work on diabetic diet and exercise as tolerated. Yearly foot exam, and annual eye exam.   Continue metformin  May consider jardiance  Some potential for adderal to inhibit insulin secretion and decrease glucose uptake      Orders: -     Comprehensive metabolic panel with GFR -     Hemoglobin A1c -     Microalbumin / creatinine urine ratio -     Referral to Nutrition and Diabetes Services  Class 3 severe obesity due to excess calories with serious comorbidity and body mass index (BMI) of 50.0 to 59.9 in adult [facility]) Assessment & Plan: Pt advised to work on diet and exercise as tolerated   Orders: -     Referral to Nutrition and Diabetes Services  Vitamin D deficiency Assessment & Plan: Ordered vitamin d pending results.    Orders: -     VITAMIN D 25 Hydroxy (Vit-D Deficiency, Fractures)  Mixed hyperlipidemia Assessment & Plan: Ordered lipid panel, pending results. Work on low cholesterol diet and exercise as tolerated   Orders: -     Lipid panel -     Referral to Nutrition and Diabetes Services  Attention deficit hyperactivity disorder (ADHD), predominantly hyperactive type Assessment &  Plan: Continue adderall as prescribed F/u with psychiatry as scheduled   Primary hypertension Assessment & Plan: Continue valsartan 80 mg  Goal <140/90\ At goal in office     Orders: -     Comprehensive metabolic panel with GFR -     Referral to Nutrition and Diabetes Services  Psoriasis -     Calcipotriene; Apply topically 2 (two) times daily.  Dispense:  60 g; Refill: 0     Return in about 6 months (around 05/02/2024) for f/u CPE.  Felicita Horns, MSN, APRN, FNP-C [facility]

## 2023-11-01 NOTE — Assessment & Plan Note (Addendum)
 Ordered hga1c today and urine m/a pending results. Work on diabetic diet and exercise as tolerated. Yearly foot exam, and annual eye exam.   Continue metformin  May consider jardiance  Some potential for adderal to inhibit insulin secretion and decrease glucose uptake

## 2023-11-01 NOTE — Assessment & Plan Note (Signed)
 Pt advised to work on diet and exercise as tolerated

## 2023-11-01 NOTE — Assessment & Plan Note (Signed)
 Ordered vitamin d pending results.

## 2023-11-01 NOTE — Assessment & Plan Note (Signed)
 Rx cephalexin  Pt advised to:  Please monitor site for worsening signs/symptoms of infection to include: increasing redness, increasing tenderness, increase in size, and or pustulant drainage from site. If this is to occur please let me know immediately.

## 2023-11-01 NOTE — Assessment & Plan Note (Signed)
 Ordered lipid panel, pending results. Work on low cholesterol diet and exercise as tolerated

## 2023-11-05 ENCOUNTER — Encounter: Payer: Self-pay | Admitting: Family

## 2023-11-26 ENCOUNTER — Other Ambulatory Visit: Payer: Self-pay | Admitting: Family

## 2023-11-26 DIAGNOSIS — E119 Type 2 diabetes mellitus without complications: Secondary | ICD-10-CM

## 2023-11-27 ENCOUNTER — Ambulatory Visit

## 2023-11-27 ENCOUNTER — Other Ambulatory Visit: Payer: Self-pay | Admitting: Family

## 2023-11-27 DIAGNOSIS — I10 Essential (primary) hypertension: Secondary | ICD-10-CM

## 2023-12-12 ENCOUNTER — Ambulatory Visit
Admission: RE | Admit: 2023-12-12 | Discharge: 2023-12-12 | Disposition: A | Discharge status: Home / Self Care | Source: Ambulatory Visit | Attending: Family | Admitting: Family

## 2023-12-12 DIAGNOSIS — Z1231 Encounter for screening mammogram for malignant neoplasm of breast: Secondary | ICD-10-CM | POA: Diagnosis present

## 2023-12-17 ENCOUNTER — Ambulatory Visit: Payer: Self-pay | Admitting: Family

## 2023-12-20 NOTE — Progress Notes (Deleted)
  Start: *** end: *** Patient is here today *** Patient would like to learn *** Patient lives with ***.  *** shopping and cooking.  History includes:  *** Medications include:  *** Labs noted:  ***  7.2%, TG 215, LDL 106, met

## 2023-12-25 ENCOUNTER — Other Ambulatory Visit: Payer: Self-pay | Admitting: Family

## 2023-12-25 DIAGNOSIS — E119 Type 2 diabetes mellitus without complications: Secondary | ICD-10-CM

## 2023-12-28 ENCOUNTER — Ambulatory Visit: Admitting: Dietician

## 2023-12-28 DIAGNOSIS — E782 Mixed hyperlipidemia: Secondary | ICD-10-CM

## 2023-12-28 DIAGNOSIS — E11649 Type 2 diabetes mellitus with hypoglycemia without coma: Secondary | ICD-10-CM

## 2023-12-28 DIAGNOSIS — E66813 Obesity, class 3: Secondary | ICD-10-CM

## 2023-12-28 DIAGNOSIS — I10 Essential (primary) hypertension: Secondary | ICD-10-CM

## 2024-05-24 ENCOUNTER — Other Ambulatory Visit: Payer: Self-pay | Admitting: Family

## 2024-05-24 DIAGNOSIS — I10 Essential (primary) hypertension: Secondary | ICD-10-CM
